# Patient Record
Sex: Male | Born: 1990 | Race: White | Hispanic: No | Marital: Married | Smoking: Never smoker
Health system: Southern US, Community
[De-identification: ages and names within clinical notes are randomized; demographics above are authoritative.]

## PROBLEM LIST (undated history)

## (undated) DIAGNOSIS — T7840XA Allergy, unspecified, initial encounter: Secondary | ICD-10-CM

## (undated) DIAGNOSIS — J309 Allergic rhinitis, unspecified: Secondary | ICD-10-CM

## (undated) DIAGNOSIS — Z8679 Personal history of other diseases of the circulatory system: Secondary | ICD-10-CM

## (undated) DIAGNOSIS — M26629 Arthralgia of temporomandibular joint, unspecified side: Secondary | ICD-10-CM

## (undated) DIAGNOSIS — B029 Zoster without complications: Secondary | ICD-10-CM

## (undated) DIAGNOSIS — Z889 Allergy status to unspecified drugs, medicaments and biological substances status: Secondary | ICD-10-CM

## (undated) DIAGNOSIS — G4700 Insomnia, unspecified: Secondary | ICD-10-CM

## (undated) HISTORY — DX: Arthralgia of temporomandibular joint, unspecified side: M26.629

## (undated) HISTORY — DX: Allergy status to unspecified drugs, medicaments and biological substances: Z88.9

## (undated) HISTORY — PX: NO PAST SURGERIES: SHX2092

## (undated) HISTORY — DX: Allergy, unspecified, initial encounter: T78.40XA

## (undated) HISTORY — DX: Personal history of other diseases of the circulatory system: Z86.79

## (undated) HISTORY — DX: Allergic rhinitis, unspecified: J30.9

## (undated) HISTORY — DX: Insomnia, unspecified: G47.00

## (undated) HISTORY — DX: Zoster without complications: B02.9

---

## 2012-03-22 ENCOUNTER — Ambulatory Visit: Payer: Self-pay | Admitting: Vascular Surgery

## 2012-03-22 LAB — BASIC METABOLIC PANEL
Anion Gap: 8 (ref 7–16)
BUN: 9 mg/dL (ref 7–18)
Chloride: 107 mmol/L (ref 98–107)
Creatinine: 0.67 mg/dL (ref 0.60–1.30)
EGFR (African American): >60
Sodium: 141 mmol/L (ref 136–145)

## 2012-04-11 ENCOUNTER — Ambulatory Visit: Payer: Self-pay | Admitting: Anesthesiology

## 2012-04-11 LAB — CBC
HCT: 44.9 % (ref 40.0–52.0)
HGB: 14.6 g/dL (ref 13.0–18.0)
MCH: 29 pg (ref 26.0–34.0)
Platelet: 221 10*3/uL (ref 150–440)
RBC: 5.04 10*6/uL (ref 4.40–5.90)
WBC: 5.6 10*3/uL (ref 3.8–10.6)

## 2012-04-15 ENCOUNTER — Ambulatory Visit: Payer: Self-pay | Admitting: Vascular Surgery

## 2012-11-18 ENCOUNTER — Ambulatory Visit: Payer: Self-pay | Admitting: Family Medicine

## 2014-10-26 IMAGING — CR DG SHOULDER 3+V*L*
1 series · 3 of 3 positions shown · non-contrast
Comparison: none

RESULT:      There is no evidence of fracture, dislocation, or malalignment.

[Series 1: internal rotate · 0.17mm/px · 3 of 3 slices shown]
[im 1/3]
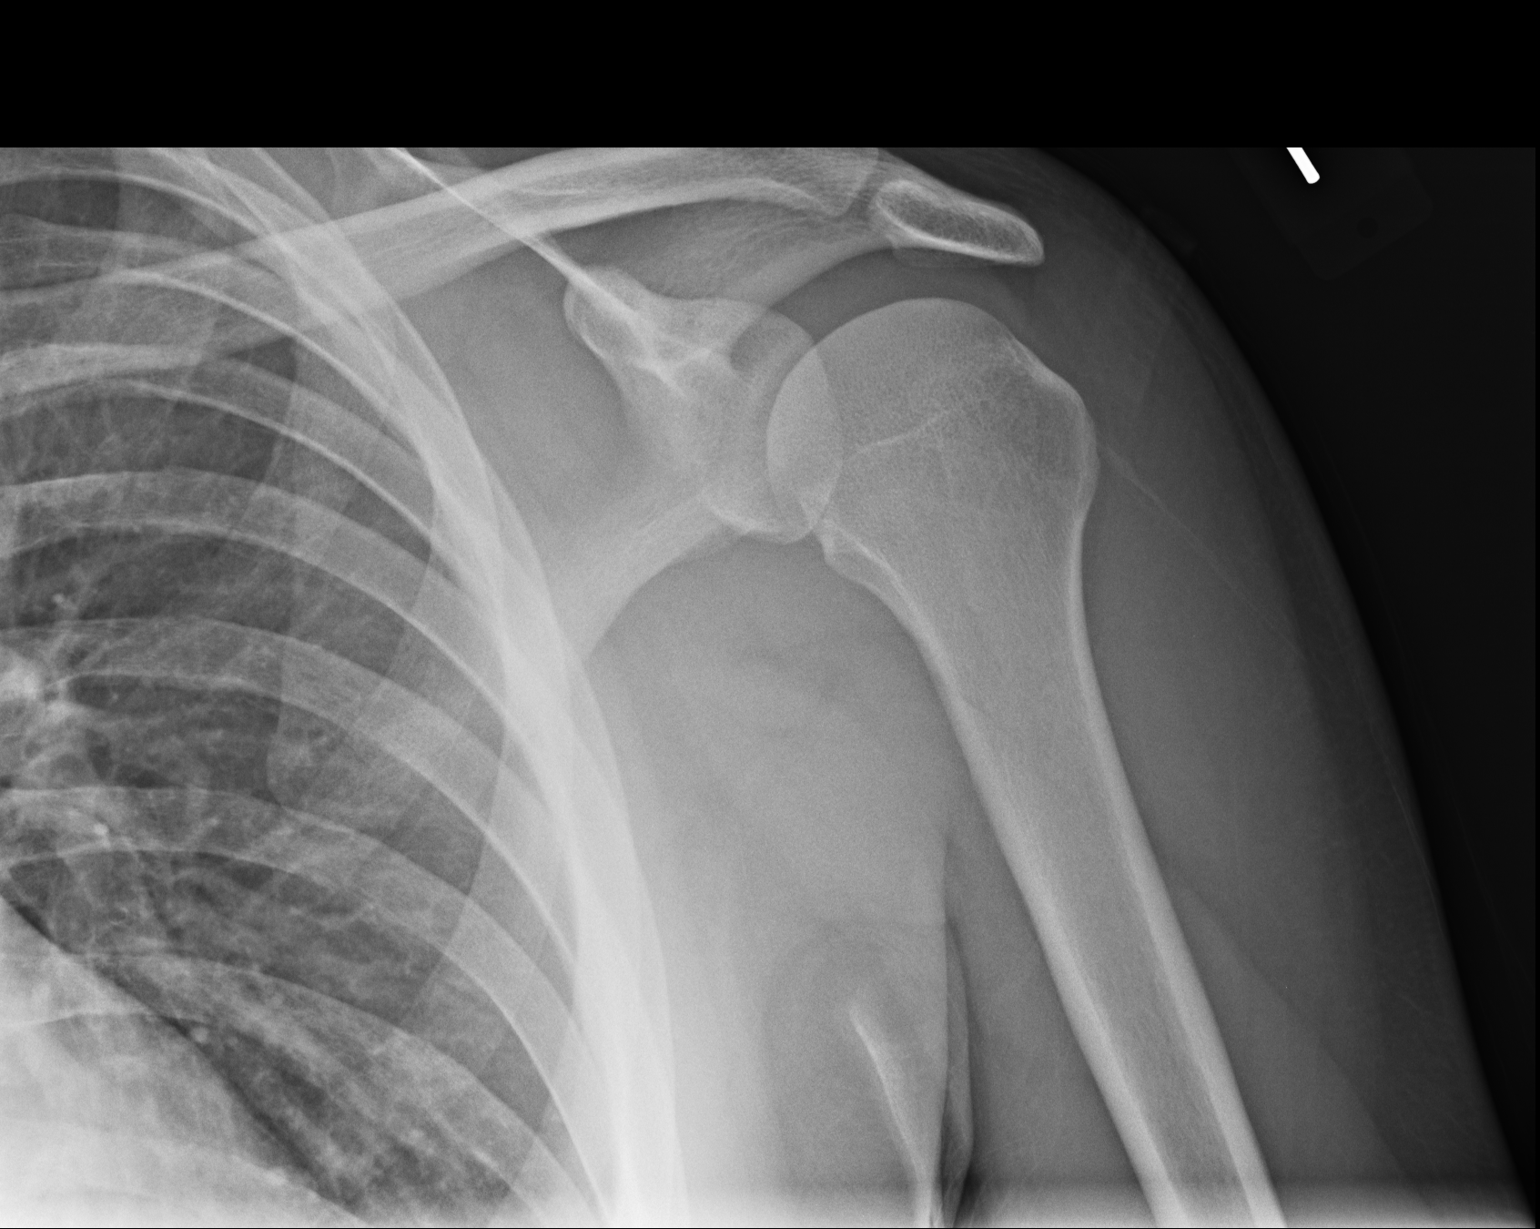
[im 2/3]
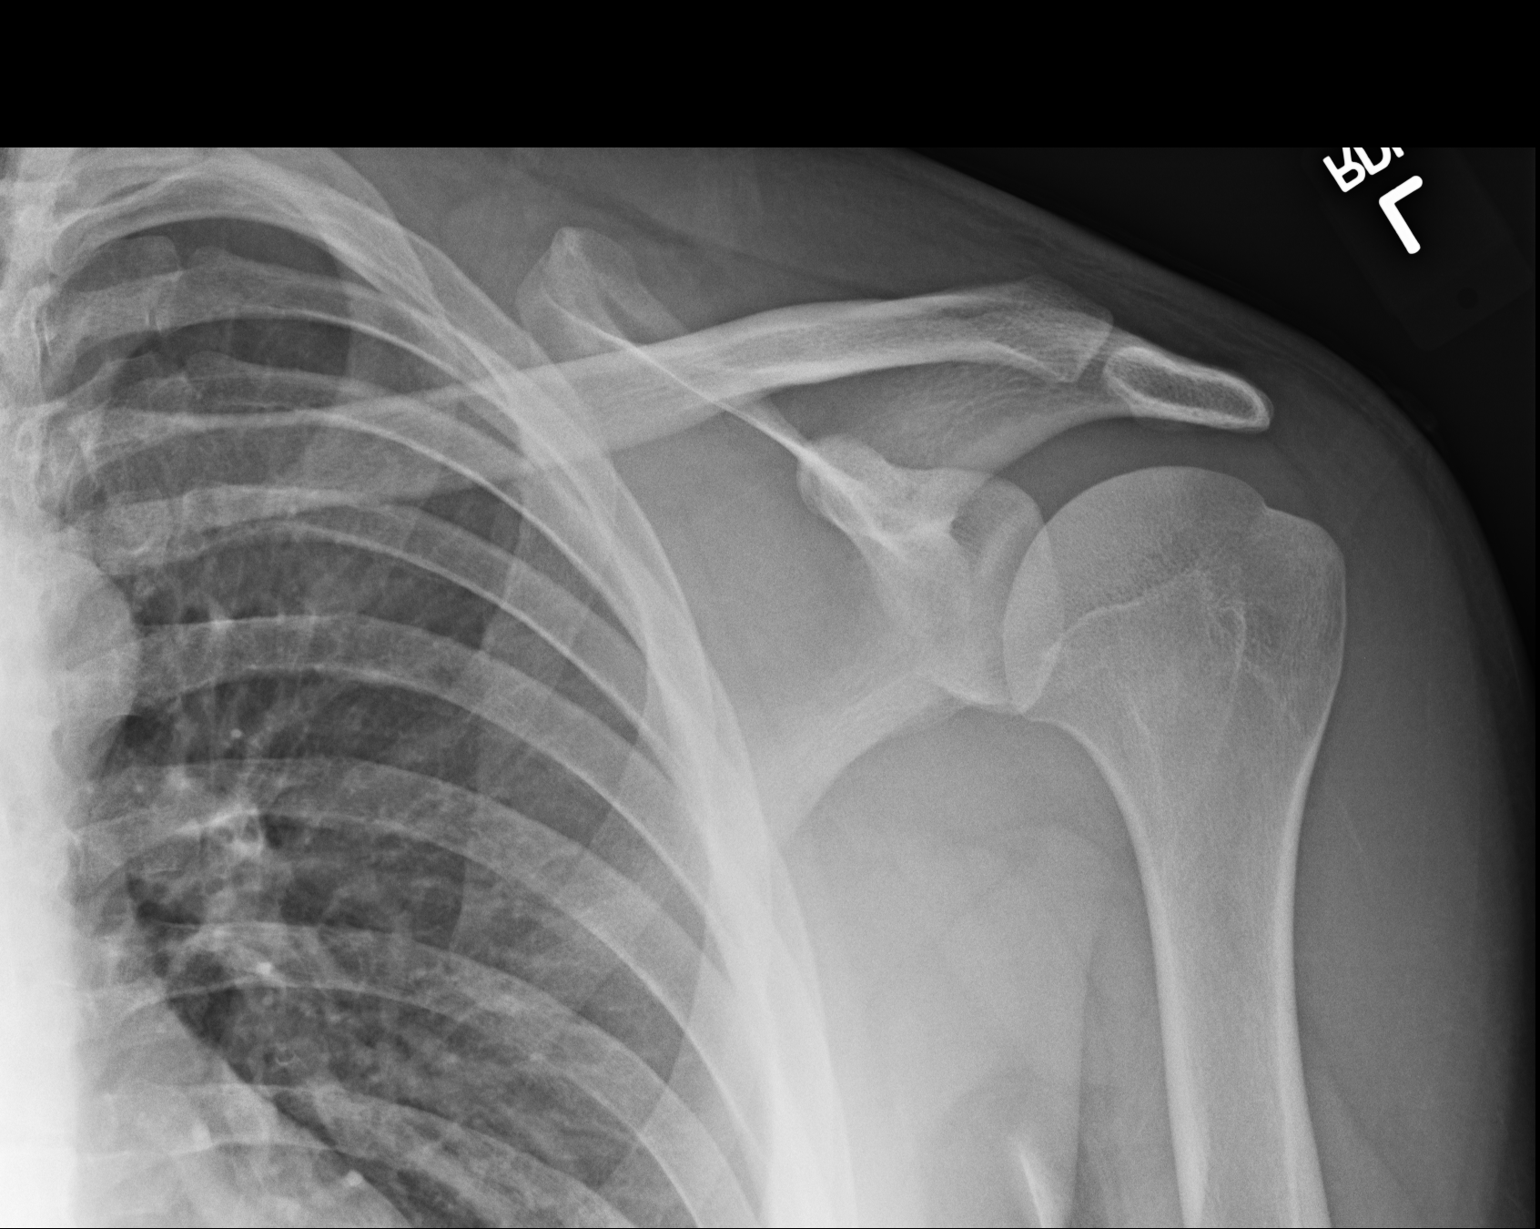
[im 3/3]
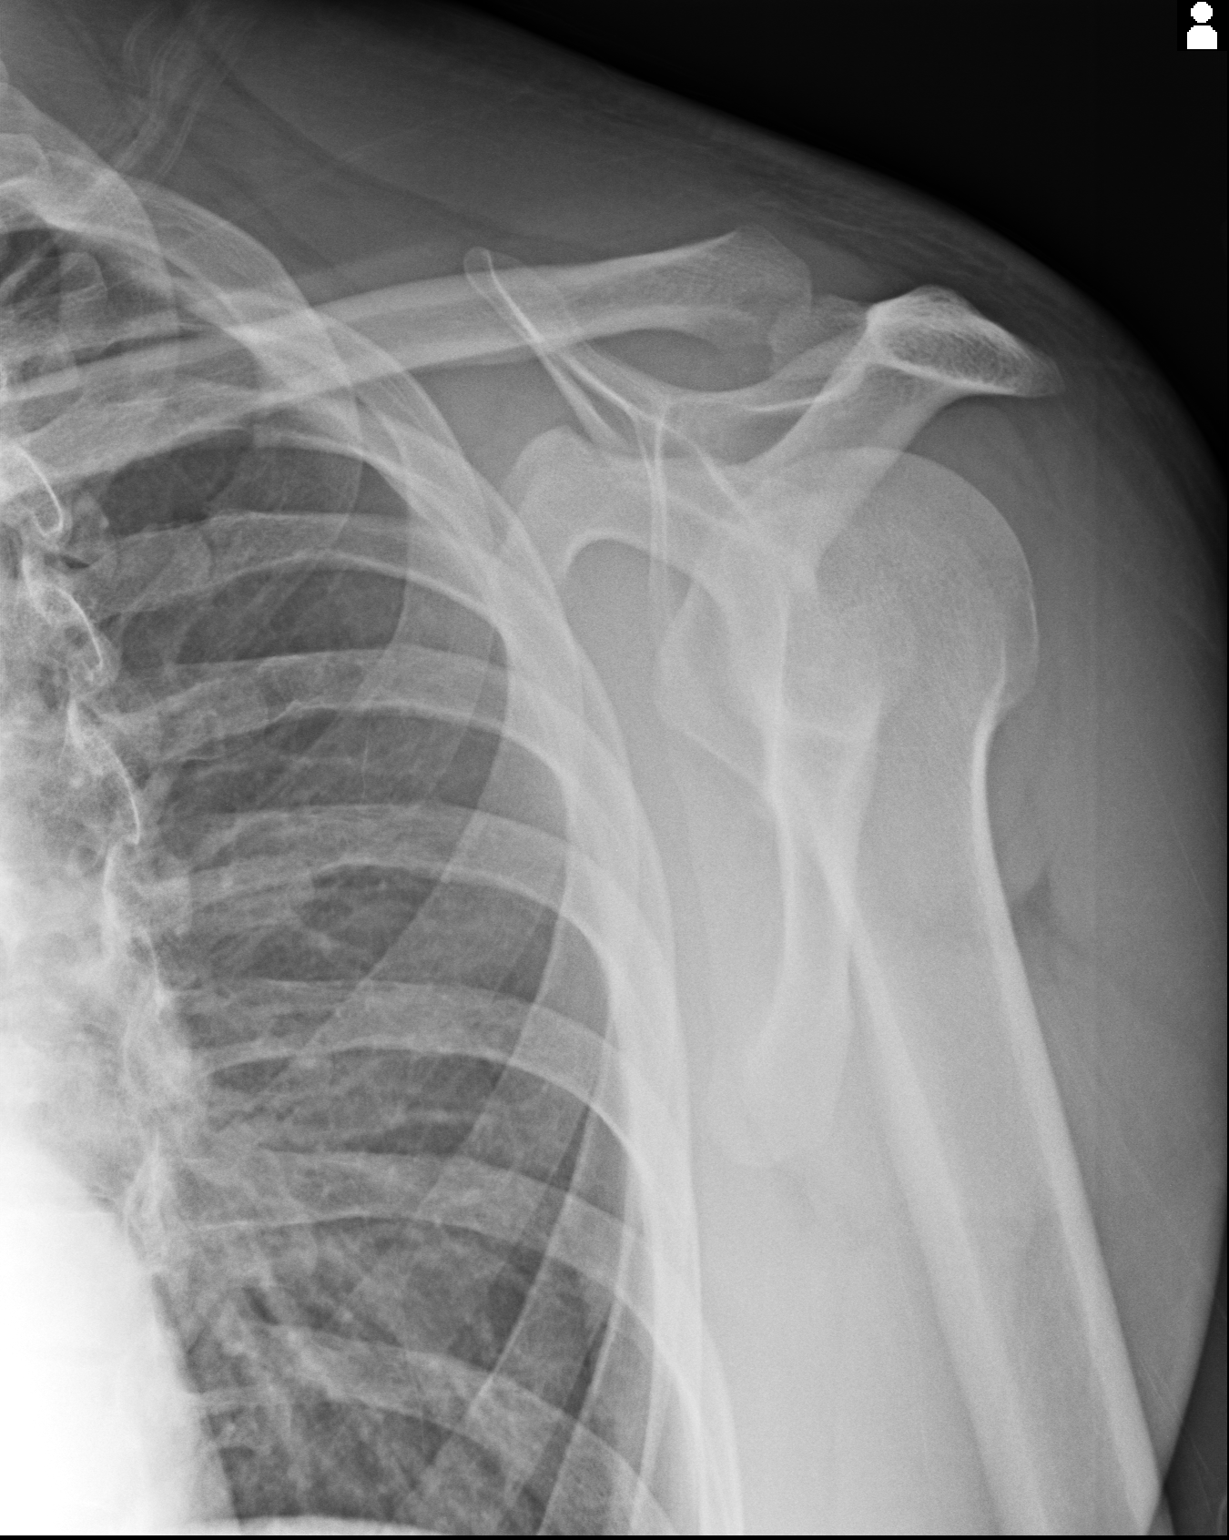

[3 of 3 positions shown; findings below may reference images not displayed]

IMPRESSION: 1. No evidence of acute abnormalities.
2. If there are persistent complaints of pain or persistent clinical
concern, a repeat evaluation in 7-10 days is recommended if clinically
warranted.

## 2015-02-17 NOTE — Op Note (Signed)
PATIENT NAME:  Craig [name], Craig [name] MR#:  [id number] DATE OF BIRTH:  [birth year]  DATE OF PROCEDURE:  03/22/2012  PREOPERATIVE DIAGNOSIS: Venous aneurysms right arm cephalic vein.   POSTOPERATIVE DIAGNOSIS: Venous aneurysms right arm cephalic vein.  PROCEDURE PERFORMED:   1. Introduction catheter into superior vena cava for superior venacavogram.  2. Right upper extremity venography.   SURGEON: Levora DredgeGregory Icelynn Onken, MD   SEDATION: Versed 5 mg plus fentanyl 200 mcg administered IV. Continuous ECG, pulse oximetry and cardiopulmonary monitoring was performed throughout the entire procedure by the Interventional Radiology nurse. Total sedation time was 30 minutes.   ACCESS: Vein right forearm.   CONTRAST USED: Isovue 20 mL.   FLUOROSCOPY TIME: 0.7 minutes.   INDICATIONS: [name] presented to the office with pain associated with a venous aneurysm in the antecubital fossa. Duplex ultrasound demonstrated a second aneurysm in the mid cephalic vein. The central venous anatomy is uncertain, and the patient is, therefore, undergoing venography to plan for surgical treatment of his venous aneurysms. The risks and benefits were reviewed. All questions are answered. The patient agrees to proceed.   DESCRIPTION OF PROCEDURE: The patient is taken to the Special Procedures suite and placed in the supine position. After adequate sedation, his right arm is extended and prepped and draped in a sterile fashion. Ultrasound is placed in a sterile sleeve. Ultrasound is utilized secondary to lack of appropriate landmarks and to avoid vascular injury. Under direct ultrasound visualization, the forearm vein is identified. It is echolucent and homogeneous, easily compressible, indicating patency. Image is recorded for the permanent record. Using a micropuncture needle after 1% lidocaine is infiltrated in the skin, the vein is accessed. A Microwire, followed by a micro sheath is inserted. Right upper extremity venography is then  performed through the micro sheath.   A Glidewire and KMP catheter are then utilized to obtain better images of the shoulder area as well as the central venous anatomy. A Glidewire is introduced through the micropuncture sheath, and the micropuncture sheath is removed, and the KMP catheter is advanced over the wire directly. The catheter is positioned initially at the level of the deltoids and subsequently in the subclavian. After review of the images, the catheter is pulled, light pressure is held, and there are no immediate complications.   INTERPRETATION: Two large aneurysms of the cephalic vein are identified. There is distal runoff via the basilic as well as the brachial veins. The central veins are widely patent.   SUMMARY: No other abnormalities identified in the venous system except for the tandem aneurysms of the cephalic vein.   ____________________________ Renford DillsGregory G. Breelyn Icard, MD ggs:cbb [name]: 03/22/2012 10:55:35 ET T: 03/22/2012 11:28:28 ET JOB#: [id number]  cc: Renford DillsGregory G. Hulet Ehrmann, MD, <Dictator> Renford DillsGREGORY G Mayana Irigoyen MD ELECTRONICALLY SIGNED 03/25/2012 9:00

## 2015-02-17 NOTE — Op Note (Signed)
PATIENT NAME:  [name], Craig [name] DATE OF BIRTH:  [birth year]  DATE OF PROCEDURE:  04/15/2012  PREOPERATIVE DIAGNOSIS: Right arm cephalic vein aneurysm.   POSTOPERATIVE DIAGNOSIS: Right arm cephalic vein aneurysm.   PROCEDURE: Resection right arm cephalic vein aneurysm.   SURGEON: Renford DillsGregory G Nariah Morgano, M.D.   ANESTHESIA: General by LMA.   FLUIDS: Per anesthesia record.   ESTIMATED BLOOD LOSS: Minimal.   SPECIMEN: Venous aneurysm photographed for permanent record.   INDICATIONS: [name] is a 24 year old gentleman who has been having increasing pain associated with a large venous aneurysm. He has undergone preoperative imaging and is now undergoing resection of his aneurysm. The risks and benefits were reviewed. All questions are answered. The patient agrees to proceed.   DESCRIPTION OF PROCEDURE: The patient is taken to the operating room and placed in the supine position. After adequate general anesthesia is induced and appropriate time-out has been called, he is positioned with his right arm extended palm upward. Right arm is prepped and draped in a circumferential fashion.   0.25% Marcaine with epinephrine is infiltrated in the soft tissues overlying the cephalic vein at four separate locations, beginning at the level of the antecubital fossa and in a latter fashion moving more proximal. Four separate incisions are created and the skin is incised with a 15 blade scalpel. The vein is identified. It is then skeletonized from the surrounding soft tissues. Tributaries are ligated with 2-0 silk ties or surgical clips and through this series of incisions the entire segment is removed in two separate but otherwise complete pieces. It is then photographed on the back table. Wounds are inspected. Hemostasis is obtained. 3-0 Vicryl is used to close the subcutaneous tissues and then 4-0 Monocryl is used in a subcuticular fashion. Dermabond is applied. The patient tolerated the procedure well  and there were no immediate complications.      ____________________________ Renford DillsGregory G. Chaeli Judy, MD ggs:bjt D: 04/15/2012 08:43:27 ET T: 04/15/2012 12:57:42 ET JOB#: [id number]  cc: Renford DillsGregory G. Maryjane Benedict, MD, <Dictator> Leo GrosserNancy J. Maloney, MD Renford DillsGREGORY G Donnalee Cellucci MD ELECTRONICALLY SIGNED 04/21/2012 10:03

## 2016-02-28 ENCOUNTER — Encounter: Payer: Self-pay | Admitting: Family Medicine

## 2016-02-28 ENCOUNTER — Ambulatory Visit (INDEPENDENT_AMBULATORY_CARE_PROVIDER_SITE_OTHER): Payer: BLUE CROSS/BLUE SHIELD | Admitting: Family Medicine

## 2016-02-28 VITALS — BP 122/60 | HR 120 | Temp 99.7°F | Resp 16 | Ht 75.0 in | Wt 309.2 lb

## 2016-02-28 DIAGNOSIS — J020 Streptococcal pharyngitis: Secondary | ICD-10-CM

## 2016-02-28 DIAGNOSIS — I868 Varicose veins of other specified sites: Secondary | ICD-10-CM | POA: Insufficient documentation

## 2016-02-28 DIAGNOSIS — J029 Acute pharyngitis, unspecified: Secondary | ICD-10-CM

## 2016-02-28 LAB — POCT RAPID STREP A (OFFICE): Rapid Strep A Screen: POSITIVE — AB

## 2016-02-28 MED ORDER — AMOXICILLIN 875 MG PO TABS: 875.0000 mg | ORAL_TABLET | Freq: Two times a day (BID) | ORAL | Status: DC

## 2016-02-28 NOTE — Progress Notes (Signed)
Subjective:     Patient ID: Craig [name], male   DOB: 03/26/1991, 25 y.o.   MRN: [id number]  HPI  Chief Complaint  Patient presents with  . Sore Throat    Patient comes in office today with complaints of sore throat, fever and body ache for the past 24hrs. Patient states that he was around his nephew this week who had tested positive for strep two days ago. Patient denies any difficulty swallowing, he has been taking otc Tylenol for relief.     Review of Systems     Objective:   Physical Exam  Constitutional: He appears well-developed and well-nourished. No distress.  Ears: T.M's intact without inflammation Throat: moderate tonsillar enlargement, mild erythema, no exudate noted Neck: no cervical adenopathy Lungs: clear     Assessment:    1. Pharyngitis - POCT rapid strep A  2. Strep throat - amoxicillin (AMOXIL) 875 MG tablet; Take 1 tablet (875 mg total) by mouth 2 (two) times daily.  Dispense: 20 tablet; Refill: 0    Plan:    Follow up as needed. Discussed infection control measures.

## 2016-02-28 NOTE — Patient Instructions (Signed)
Wash hands frequently and don't share food or drink.

## 2016-11-02 ENCOUNTER — Telehealth: Payer: Self-pay | Admitting: Family Medicine

## 2016-11-02 NOTE — Telephone Encounter (Signed)
Please call pt. Thank you.

## 2016-11-02 NOTE — Telephone Encounter (Signed)
Please advise. Emily Drozdowski, CMA  

## 2016-11-02 NOTE — Telephone Encounter (Signed)
Pt called wanting to know if you would take on his wife Neysa BonitoChristy as a new patient.  She is not on any medications and does not have a primary care doctor.  Their call back number is [phone]  Thanks, Barth Kirksteri

## 2016-11-02 NOTE — Telephone Encounter (Signed)
Not taking new patients at this time.

## 2017-06-09 ENCOUNTER — Ambulatory Visit (INDEPENDENT_AMBULATORY_CARE_PROVIDER_SITE_OTHER): Payer: BLUE CROSS/BLUE SHIELD | Admitting: Family Medicine

## 2017-06-09 ENCOUNTER — Encounter: Payer: Self-pay | Admitting: Family Medicine

## 2017-06-09 ENCOUNTER — Telehealth: Payer: Self-pay | Admitting: Family Medicine

## 2017-06-09 VITALS — BP 122/78 | HR 74 | Temp 98.6°F | Resp 16 | Wt 316.0 lb

## 2017-06-09 DIAGNOSIS — H109 Unspecified conjunctivitis: Secondary | ICD-10-CM | POA: Diagnosis not present

## 2017-06-09 MED ORDER — POLYMYXIN B-TRIMETHOPRIM 10000-0.1 UNIT/ML-% OP SOLN: 2.0000 [drp] | Freq: Four times a day (QID) | OPHTHALMIC | 0 refills | Status: DC

## 2017-06-09 NOTE — Telephone Encounter (Signed)
Advised patient as below and he will be seen in office this morning. KW

## 2017-06-09 NOTE — Telephone Encounter (Signed)
Please review message. KW 

## 2017-06-09 NOTE — Progress Notes (Deleted)
eye

## 2017-06-09 NOTE — Telephone Encounter (Signed)
For viral pinkeye the treatment is use of frequent warm, wet compresses. If you develop yellow drainage (not watery) then we will need to see you.

## 2017-06-09 NOTE — Patient Instructions (Signed)
Discussed use of frequent warm compresses.

## 2017-06-09 NOTE — Telephone Encounter (Signed)
pt called saying son has confirmed pinkeye.  Now he is having the same symptoms.  Can we call in something for this.  [facility]  Call back is [phone]  Thanks Barth Kirksteri

## 2017-06-09 NOTE — Progress Notes (Signed)
Subjective:     Patient ID: Craig SprinklesCaleb D Sprung, male   DOB: 30-Dec-1990, 26 y.o.   MRN: [id number]  HPI  Chief Complaint  Patient presents with  . Eye Drainage    Patient comes in office today with concerns of exposure to pink eye from child. Patient states for the past 24hrs he has had yellow drainage, itchy eyes and crusting of how right eye. Patient used last night prescription eye drops from child to help with symptoms  Reports his son has gotten quickly better on Polytrim drops. Denies changes in his vision or contact use.   Review of Systems     Objective:   Physical Exam  Constitutional: He appears well-developed and well-nourished. No distress.  Eyes: Pupils are equal, round, and reactive to light. Right eye exhibits no discharge.  Right eye with mild medial scleral erythema and mild upper eyelid swelling V.A. Intact to # of fingers.       Assessment:    1. Bacterial conjunctivitis of right eye - trimethoprim-polymyxin b (POLYTRIM) ophthalmic solution; Place 2 drops into the right eye every 6 (six) hours.  Dispense: 10 mL; Refill: 0    Plan:   Discussed use of warm, wet compresses.

## 2017-06-12 DIAGNOSIS — M79644 Pain in right finger(s): Secondary | ICD-10-CM | POA: Diagnosis not present

## 2017-06-12 DIAGNOSIS — R600 Localized edema: Secondary | ICD-10-CM | POA: Diagnosis not present

## 2017-06-12 DIAGNOSIS — S66911A Strain of unspecified muscle, fascia and tendon at wrist and hand level, right hand, initial encounter: Secondary | ICD-10-CM | POA: Diagnosis not present

## 2017-06-12 DIAGNOSIS — S56117A Strain of flexor muscle, fascia and tendon of right little finger at forearm level, initial encounter: Secondary | ICD-10-CM | POA: Diagnosis not present

## 2017-06-12 DIAGNOSIS — S6991XA Unspecified injury of right wrist, hand and finger(s), initial encounter: Secondary | ICD-10-CM | POA: Diagnosis not present

## 2017-06-12 DIAGNOSIS — R5383 Other fatigue: Secondary | ICD-10-CM | POA: Diagnosis not present

## 2017-09-24 ENCOUNTER — Telehealth: Payer: Self-pay | Admitting: Family Medicine

## 2017-09-24 ENCOUNTER — Encounter: Payer: Self-pay | Admitting: Family Medicine

## 2017-09-24 ENCOUNTER — Ambulatory Visit (INDEPENDENT_AMBULATORY_CARE_PROVIDER_SITE_OTHER): Payer: BLUE CROSS/BLUE SHIELD | Admitting: Family Medicine

## 2017-09-24 VITALS — BP 130/78 | HR 103 | Temp 98.7°F | Resp 16 | Wt 320.4 lb

## 2017-09-24 DIAGNOSIS — J039 Acute tonsillitis, unspecified: Secondary | ICD-10-CM | POA: Diagnosis not present

## 2017-09-24 LAB — POCT RAPID STREP A (OFFICE): Rapid Strep A Screen: NEGATIVE

## 2017-09-24 NOTE — Patient Instructions (Signed)
Discussed use of Mucinex D for congestion, Delsym for cough, and Benadryl for postnasal drainage 

## 2017-09-24 NOTE — Progress Notes (Signed)
Subjective:     Patient ID: Craig [name], male   DOB: [birth year], 26 y.o.   MRN: [id number] Chief Complaint  Patient presents with  . Sore Throat    Patient comes into office with complaint of sore throat for the past 24hrs, patient reports pain when swallowing liquids or solids. Patient denies any URI symptoms.    HPI Reports the flu shot one month ago.  Review of Systems     Objective:   Physical Exam  Constitutional: He appears well-developed and well-nourished. No distress.  Ears: T.M's intact without inflammation Throat:moderately enlarged tonsils with small amount of exudate. Neck: no cervical adenopathy Lungs: clear     Assessment:    1. Tonsillitis: probable early URI - POCT rapid strep A    Plan:    Discussed use of otc medication.

## 2018-08-10 ENCOUNTER — Ambulatory Visit (INDEPENDENT_AMBULATORY_CARE_PROVIDER_SITE_OTHER): Payer: BLUE CROSS/BLUE SHIELD | Admitting: Physician Assistant

## 2018-08-10 VITALS — BP 152/88 | HR 92 | Temp 98.9°F

## 2018-08-10 DIAGNOSIS — Z91038 Other insect allergy status: Secondary | ICD-10-CM | POA: Diagnosis not present

## 2018-08-10 MED ORDER — PREDNISONE 10 MG (21) PO TBPK: ORAL_TABLET | ORAL | 0 refills | Status: DC

## 2018-08-10 NOTE — Patient Instructions (Signed)
Insect Bite, Adult An insect bite can make your skin red, itchy, and swollen. Some insects can spread disease to people with a bite. However, most insect bites do not lead to disease, and most are not serious. Follow these instructions at home: Bite area care  Do not scratch the bite area.  Keep the bite area clean and dry.  Wash the bite area every day with soap and water as told by your doctor.  Check the bite area every day for signs of infection. Check for: ? More redness, swelling, or pain. ? Fluid or blood. ? Warmth. ? Pus. Managing pain, itching, and swelling  You may put any of these on the bite area as told by your doctor: ? A baking soda paste. ? Cortisone cream. ? Calamine lotion.  If directed, put ice on the bite area. ? Put ice in a plastic bag. ? Place a towel between your skin and the bag. ? Leave the ice on for 20 minutes, 2-3 times a day. Medicines  Take medicines or put medicines on your skin only as told by your doctor.  If you were prescribed an antibiotic medicine, use it as told by your doctor. Do not stop using the antibiotic even if your condition improves. General instructions  Keep all follow-up visits as told by your doctor. This is important. How is this prevented? To help you have a lower risk of insect bites:  When you are outside, wear clothing that covers your arms and legs.  Use insect repellent. The best insect repellents have: ? An active ingredient of DEET, picaridin, oil of lemon eucalyptus (OLE), or IR3535. ? Higher amounts of DEET or another active ingredient than other repellents have.  If your home windows do not have screens, think about putting some in.  Contact a doctor if:  You have more redness, swelling, or pain in the bite area.  You have fluid, blood, or pus coming from the bite area.  The bite area feels warm.  You have a fever. Get help right away if:  You have joint pain.  You have a rash.  You have  shortness of breath.  You feel more tired or sleepy than you normally do.  You have neck pain.  You have a headache.  You feel weaker than you normally do.  You have chest pain.  You have pain in your belly.  You feel sick to your stomach (nauseous) or you throw up (vomit). Summary  An insect bite can make your skin red, itchy, and swollen.  Do not scratch the bite area, and keep it clean and dry.  Ice can help with pain and itching from the bite. This information is not intended to replace advice given to you by your health care provider. Make sure you discuss any questions you have with your health care provider. Document Released: 10/09/2000 Document Revised: 05/14/2016 Document Reviewed: 02/27/2015 Elsevier Interactive Patient Education  2018 Elsevier Inc.  

## 2018-08-10 NOTE — Progress Notes (Signed)
       Patient: Craig [name] Male    DOB: 1991/07/05   27 y.o.   MRN: [id number] Visit Date: 08/10/2018  Today's Provider: Trey Sailors, PA-C   Chief Complaint  Patient presents with  . Insect Bite   Subjective:    Insect Bite Patient presents today with ant bites to the left hand, right arm and pinky finger. These happened last night. Patient has swollen in the left hand and right forearm. There is some slight fluid weeping from areas. He reports that there has been no pus or pain in the areas. He reports that the areas has been very itchy and he has been scratching it. Patient denies fevers, chills, nausea, and vomiting. symptoms at this time. Patient has not taking any medication for the bites.  BP Readings from Last 3 Encounters:  08/10/18 (!) 152/88  09/24/17 130/78  06/09/17 122/78       No Known Allergies  No current outpatient medications on file.  Review of Systems  Constitutional: Negative.   HENT: Negative.   Eyes: Negative.   Cardiovascular: Negative.   Gastrointestinal: Negative.   Musculoskeletal: Negative.   Allergic/Immunologic: Negative.   Neurological: Negative.     Social History   Tobacco Use  . Smoking status: Never Smoker  . Smokeless tobacco: Never Used  Substance Use Topics  . Alcohol use: Not on file   Objective:   BP (!) 152/88 (BP Location: Right Arm, Patient Position: Sitting, Cuff Size: Normal)   Pulse 92   Temp 98.9 F (37.2 C) (Oral)   SpO2 98%  Vitals:   08/10/18 1149  BP: (!) 152/88  Pulse: 92  Temp: 98.9 F (37.2 C)  TempSrc: Oral  SpO2: 98%     Physical Exam  Constitutional: He is oriented to person, place, and time.  Musculoskeletal:       Arms:      Hands: Neurological: He is alert and oriented to person, place, and time.  Skin: Skin is warm and dry. There is erythema.  Psychiatric: He has a normal mood and affect. His behavior is normal.        Assessment & Plan:     1. 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 Infection, though 2/2 infection is a possibility. Will treat with steroid as below and would like patient to call back if worsening, would send antibiotic at that point.  - predniSONE (STERAPRED UNI-PAK 21 TAB) 10 MG (21) TBPK tablet; Take 6 pills on day 1, take 5 pills on day 2, take 4 pills on day 3 and so on until complete.  Dispense: 21 tablet; Refill: 0  Follow up Prn if symptoms worsen  The entirety of the information documented in the History of Present Illness, Review of Systems and Physical Exam were personally obtained by me. Portions of this information were initially documented by Rondel Baton, CMA and reviewed by me for thoroughness and accuracy.        Trey Sailors, PA-C  [facility]

## 2018-09-22 DIAGNOSIS — S39012A Strain of muscle, fascia and tendon of lower back, initial encounter: Secondary | ICD-10-CM | POA: Diagnosis not present

## 2018-09-22 DIAGNOSIS — M545 Low back pain: Secondary | ICD-10-CM | POA: Diagnosis not present

## 2018-09-22 DIAGNOSIS — X501XXA Overexertion from prolonged static or awkward postures, initial encounter: Secondary | ICD-10-CM | POA: Diagnosis not present

## 2019-12-20 DIAGNOSIS — Z20828 Contact with and (suspected) exposure to other viral communicable diseases: Secondary | ICD-10-CM | POA: Diagnosis not present

## 2020-01-03 DIAGNOSIS — Z87898 Personal history of other specified conditions: Secondary | ICD-10-CM | POA: Insufficient documentation

## 2020-01-03 DIAGNOSIS — L259 Unspecified contact dermatitis, unspecified cause: Secondary | ICD-10-CM | POA: Insufficient documentation

## 2020-01-03 DIAGNOSIS — Q279 Congenital malformation of peripheral vascular system, unspecified: Secondary | ICD-10-CM | POA: Insufficient documentation

## 2020-01-03 DIAGNOSIS — J309 Allergic rhinitis, unspecified: Secondary | ICD-10-CM | POA: Insufficient documentation

## 2020-01-03 DIAGNOSIS — G4700 Insomnia, unspecified: Secondary | ICD-10-CM | POA: Insufficient documentation

## 2020-01-03 DIAGNOSIS — Z789 Other specified health status: Secondary | ICD-10-CM | POA: Insufficient documentation

## 2020-01-03 DIAGNOSIS — B029 Zoster without complications: Secondary | ICD-10-CM | POA: Insufficient documentation

## 2020-01-03 DIAGNOSIS — M26609 Unspecified temporomandibular joint disorder, unspecified side: Secondary | ICD-10-CM | POA: Insufficient documentation

## 2020-01-03 NOTE — Progress Notes (Signed)
Patient: Craig [name] Male    DOB: [birth year]   29 y.o.   MRN: [id number] Visit Date: 01/03/2020  Today's Provider: Marcille Buffy, FNP   Chief Complaint  Patient presents with  . Sinus Problem   Subjective:    Virtual Visit via Video Note  I connected with Craig [name] on 01/03/20 at 11:00 AM EST by a video enabled telemedicine application and verified that I am speaking with the correct person using two identifiers.  Location: Patient: at home  Provider: Provider: Provider's office at  [facility], [place] .      I discussed the limitations of evaluation and management by telemedicine and the availability of in person appointments. The patient expressed understanding and agreed to proceed.    I discussed the assessment and treatment plan with the patient. The patient was provided an opportunity to ask questions and all were answered. The patient agreed with the plan and demonstrated an understanding of the instructions.   The patient was advised to call back or seek an in-person evaluation if the symptoms worsen or if the condition fails to improve as anticipated.  I provided 30 minutes of non-face-to-face time during this encounter inculding time spent reviewing his chart and medical history.   Sinus Problem This is a new problem. The problem is unchanged. There has been no fever. Associated symptoms include congestion, coughing, sinus pressure, sneezing and a sore throat. Pertinent negatives include no chills, diaphoresis, ear pain, headaches, hoarse voice, neck pain, shortness of breath or swollen glands (only in AM). (Lost of taste and smell still present  ( patient states that he was diagnosed with Covid 2 weeks ago)) Past treatments include acetaminophen. The treatment provided no relief.  Rash This is a new problem. The current episode started in the past 7 days. The affected locations include the right hand and right arm. The rash is  characterized by blistering. It is unknown (patient believes exposed to poison ivy) if there was an exposure to a precipitant. Associated symptoms include congestion, coughing and a sore throat. Pertinent negatives include no shortness of breath.   [facility]. His two year old was negative but was sick a few days before he got sick.  He had a fever, headache, chills sore throat with Covid. He reports all has resolved, he has persistent nasal congestion making it hard to breath out of his nose. He started Zyrtec 10 mg around one week ago without any considerable improvement. . Mild cough. Sneezing. He denies any chest pain, chest congestion or chest pressure. He denies any distress.   Rash developed in the past 7 days after getting into some poison oak. He reports high sensitivity to it. He has intense itching without any burning. Denies any painful or burning rash. Denies paresthesias. History of contact dermatitis. History of shingles, however he reports this does not feel the same.   Patient  denies any fever, body aches,chills,chest pain, shortness of breath, nausea, vomiting, or diarrhea.    He was the only one in his family that tested positive. He has been out of work for the 14 days per [facility] however now has rash and still persistent congestion.   He denies any liver or kidney dysfunction, he is not up to date on his labs.  Denies fever and not on any fever reducers.  He denies any other illness/ exposure. Denies fatigue.  No Known Allergies No current outpatient medications on file.  Review of Systems  Constitutional: Negative for chills and diaphoresis.  HENT: Positive for congestion, sinus pressure, sneezing and sore throat. Negative for ear pain and hoarse voice.   Respiratory: Positive for cough. Negative for shortness of breath.   Musculoskeletal: Negative for neck pain.  Skin: Positive for rash.  Neurological: Negative for  headaches.    Social History   Tobacco Use  . Smoking status: Never Smoker  . Smokeless tobacco: Never Used  Substance Use Topics  . Alcohol use: Not on file      Objective:  No vital signs available.  Patient is at his home.  Provider is in office at [facility].   Patient is alert and oriented and responsive to questions Engages in conversation with provider. Speaks in full sentences without any pauses without any shortness of breath or distress.  VIDEO EXAM AS BELOW AND LIMITED DUE TO NOT IN PERSON DUE TO COVID 19 OFFICE PROTOCOL. Physical Exam Constitutional:      Appearance: Normal appearance. He is not ill-appearing or toxic-appearing.  Skin:    Findings: Rash present. Rash is vesicular.          Comments: Video exam difficult, however able to mildly erythematous rash with scattered fluid filled blisters on right palmar surface, anterior and posterior right arm.  No facial or eye involvement.    Neurological:     Mental Status: He is alert and oriented to person, place, and time.     GCS: GCS eye subscore is 4. GCS verbal subscore is 5. GCS motor subscore is 6.  Psychiatric:        Mood and Affect: Mood normal.        Behavior: Behavior normal.        Thought Content: Thought content normal.        Judgment: Judgment normal.     Comments: Video exam only. Discussed limitations. He has normal affect and answers questions appropriately.       No results found for any visits on 01/04/20.     Assessment & Plan    Acute non-recurrent pansinusitis  Irritant contact dermatitis, unspecified trigger- exposure to poison oak   Personal history of covid-19  Meds ordered this encounter  Medications  . amoxicillin-clavulanate (AUGMENTIN) 875-125 MG tablet    Sig: Take 1 tablet by mouth 2 (two) times daily.    Dispense:  20 tablet    Refill:  0  . predniSONE (DELTASONE) 10 MG tablet    Sig: Take 6 tablets (60 mg total) by mouth daily with breakfast  for 2 days, THEN 5 tablets (50 mg total) daily with breakfast for 2 days, THEN 4 tablets (40 mg total) daily with breakfast for 2 days, THEN 3 tablets (30 mg total) daily with breakfast for 2 days, THEN 2 tablets (20 mg total) daily with breakfast for 2 days, THEN 1 tablet (10 mg total) daily with breakfast for 2 days.    Dispense:  42 tablet    Refill:  0  . fluticasone (FLONASE) 50 MCG/ACT nasal spray    Sig: Place 2 sprays into both nostrils daily.    Dispense:  16 g    Refill:  1   Continue Zyrtec is recommended. He does need maintaince labs and physical as they are overdue, he was made aware and will schedule these once he is well.  If rash spreads or worsens he will seek in person visit. Recommend  Calamine lotion  Per package or [place] per package to apply to rash locally on right arm, wash hand after touching and do not touch face.  Advised patient call the office or your primary care doctor for an appointment if no improvement within 72 hours or if any symptoms change or worsen at any time  Advised ER or urgent Care if after hours or on weekend. Call 911 for emergency symptoms at any time.Patinet verbalized understanding of all instructions given/reviewed and treatment plan and has no further questions or concerns at this time.    Work note given return to work Monday March 15 th if all symptoms resolving, as he ahs met his 14 day CDC quarantine. Work note was sent to patients [facility]    The entirety of the information documented in the History of Present Illness, Review of Systems and Physical Exam were personally obtained by me. Portions of this information were initially documented by the  Certified Medical Assistant whose name is documented in [place] and reviewed by me for thoroughness and accuracy.  I have personally performed the exam and reviewed the chart and it is accurate to the best of my knowledge.  Haematologist has been used and any errors in dictation or transcription are  unintentional.   Marcille Buffy, [place]

## 2020-01-04 ENCOUNTER — Ambulatory Visit (INDEPENDENT_AMBULATORY_CARE_PROVIDER_SITE_OTHER): Payer: BC Managed Care – PPO | Admitting: Adult Health

## 2020-01-04 ENCOUNTER — Encounter: Payer: Self-pay | Admitting: Adult Health

## 2020-01-04 DIAGNOSIS — Z13220 Encounter for screening for lipoid disorders: Secondary | ICD-10-CM

## 2020-01-04 DIAGNOSIS — J0140 Acute pansinusitis, unspecified: Secondary | ICD-10-CM

## 2020-01-04 DIAGNOSIS — L249 Irritant contact dermatitis, unspecified cause: Secondary | ICD-10-CM

## 2020-01-04 DIAGNOSIS — Z8616 Personal history of COVID-19: Secondary | ICD-10-CM | POA: Diagnosis not present

## 2020-01-04 DIAGNOSIS — Z1329 Encounter for screening for other suspected endocrine disorder: Secondary | ICD-10-CM

## 2020-01-04 MED ORDER — FLUTICASONE PROPIONATE 50 MCG/ACT NA SUSP: 2.0000 | Freq: Every day | NASAL | 1 refills | Status: DC

## 2020-01-04 MED ORDER — PREDNISONE 10 MG PO TABS: ORAL_TABLET | ORAL | 0 refills | Status: AC

## 2020-01-04 MED ORDER — AMOXICILLIN-POT CLAVULANATE 875-125 MG PO TABS: 1.0000 | ORAL_TABLET | Freq: Two times a day (BID) | ORAL | 0 refills | Status: DC

## 2020-01-04 NOTE — Patient Instructions (Signed)
Prednisolone tablets What is this medicine? PREDNISOLONE (pred NISS oh lone) is a corticosteroid. It is commonly used to treat inflammation of the skin, joints, lungs, and other organs. Common conditions treated include asthma, allergies, and arthritis. It is also used for other conditions, such as blood disorders and diseases of the adrenal glands. This medicine may be used for other purposes; ask your health care provider or pharmacist if you have questions. COMMON BRAND NAME(S): Millipred, Millipred DP, Millipred DP 12-Day, Millipred DP 6 Day, Prednoral What should I tell my health care provider before I take this medicine? They need to know if you have any of these conditions:  Cushing's syndrome  diabetes  glaucoma  heart problems or disease  high blood pressure  infection such as herpes, measles, tuberculosis, or chickenpox  kidney disease  liver disease  mental problems  myasthenia gravis  osteoporosis  seizures  stomach ulcer or intestine disease including colitis and diverticulitis  thyroid problem  an unusual or allergic reaction to lactose, prednisolone, other medicines, foods, dyes, or preservatives  pregnant or trying to get pregnant  breast-feeding How should I use this medicine? Take this medicine by mouth with a glass of water. Follow the directions on the prescription label. Take it with food or milk to avoid stomach upset. If you are taking this medicine once a day, take it in the morning. Do not take more medicine than you are told to take. Do not suddenly stop taking your medicine because you may develop a severe reaction. Your doctor will tell you how much medicine to take. If your doctor wants you to stop the medicine, the dose may be slowly lowered over time to avoid any side effects. Talk to your pediatrician regarding the use of this medicine in children. Special care may be needed. Overdosage: If you think you have taken too much of this medicine  contact a poison control center or emergency room at once. NOTE: This medicine is only for you. Do not share this medicine with others. What if I miss a dose? If you miss a dose, take it as soon as you can. If it is almost time for your next dose, take only that dose. Do not take double or extra doses. What may interact with this medicine? Do not take this medicine with any of the following medications:  metyrapone  mifepristone This medicine may also interact with the following medications:  aminoglutethimide  amphotericin B  aspirin and aspirin-like medicines  barbiturates  certain medicines for diabetes, like glipizide or glyburide  cholestyramine  cholinesterase inhibitors  cyclosporine  digoxin  diuretics  ephedrine  male hormones, like estrogens and birth control pills  isoniazid  ketoconazole  NSAIDS, medicines for pain and inflammation, like ibuprofen or naproxen  phenytoin  rifampin  toxoids  vaccines  warfarin This list may not describe all possible interactions. Give your health care provider a list of all the medicines, herbs, non-prescription drugs, or dietary supplements you use. Also tell them if you smoke, drink alcohol, or use illegal drugs. Some items may interact with your medicine. What should I watch for while using this medicine? Visit your doctor or health care professional for regular checks on your progress. If you are taking this medicine over a prolonged period, carry an identification card with your name and address, the type and dose of your medicine, and your doctor's name and address. This medicine may increase your risk of getting an infection. Tell your doctor or health care professional  if you are around anyone with measles or chickenpox, or if you develop sores or blisters that do not heal properly. If you are going to have surgery, tell your doctor or health care professional that you have taken this medicine within the last  twelve months. Ask your doctor or health care professional about your diet. You may need to lower the amount of salt you eat. This medicine may increase blood sugar. Ask your healthcare provider if changes in diet or medicines are needed if you have diabetes. What side effects may I notice from receiving this medicine? Side effects that you should report to your doctor or health care professional as soon as possible:  allergic reactions like skin rash, itching or hives, swelling of the face, lips, or tongue  changes in emotions or moods  eye pain   signs and symptoms of high blood sugar such as being more thirsty or hungry or having to urinate more than normal. You may also feel very tired or have blurry vision.  signs and symptoms of infection like fever or chills; cough; sore throat; pain or trouble passing urine  slow growth in children (if used for longer periods of time)  swelling of ankles, feet  trouble sleeping  weak bones (if used for longer periods of time) Side effects that usually do not require medical attention (report to your doctor or health care professional if they continue or are bothersome):  nausea  skin problems, acne, thin and shiny skin  upset stomach  weight gain This list may not describe all possible side effects. Call your doctor for medical advice about side effects. You may report side effects to FDA at 1-800-FDA-1088. Where should I keep my medicine? Keep out of the reach of children. Store at room temperature between 15 and 30 degrees C (59 and 86 degrees F). Keep container tightly closed. Throw away any unused medicine after the expiration date. NOTE: This sheet is a summary. It may not cover all possible information. If you have questions about this medicine, talk to your doctor, pharmacist, or health care provider.  2020 Elsevier/Gold Standard (2018-07-14 10:30:56)    Fluticasone nasal spray What is this medicine? FLUTICASONE (floo TIK a  sone) is a corticosteroid. This medicine is used to treat the symptoms of allergies like sneezing, itchy red eyes, and itchy, runny, or stuffy nose. This medicine is also used to treat nasal polyps. This medicine may be used for other purposes; ask your health care provider or pharmacist if you have questions. COMMON BRAND NAME(S): ClariSpray, Flonase, Flonase Allergy Relief, Flonase Sensimist, Veramyst, XHANCE What should I tell my health care provider before I take this medicine? They need to know if you have any of these conditions:  eye disease, vision problems  infection, like tuberculosis, herpes, or fungal infection  recent surgery on nose or sinuses  taking a corticosteroid by mouth  an unusual or allergic reaction to fluticasone, steroids, other medicines, foods, dyes, or preservatives  pregnant or trying to get pregnant  breast-feeding How should I use this medicine? This medicine is for use in the nose. Follow the directions on your product or prescription label. This medicine works best if used at regular intervals. Do not use more often than directed. Make sure that you are using your nasal spray correctly. After 6 months of daily use for allergies, talk to your doctor or health care professional before using it for a longer time. Ask your doctor or health care professional if you  have any questions. Talk to your pediatrician regarding the use of this medicine in children. Special care may be needed. Some products have been used for allergies in children as young as 2 years. After 2 months of daily use without a prescription in a child, talk to your pediatrician before using it for a longer time. Use of this medicine for nasal polyps is not approved in children. Overdosage: If you think you have taken too much of this medicine contact a poison control center or emergency room at once. NOTE: This medicine is only for you. Do not share this medicine with others. What if I miss a  dose? If you miss a dose, use it as soon as you remember. If it is almost time for your next dose, use only that dose and continue with your regular schedule. Do not use double or extra doses. What may interact with this medicine?  certain antibiotics like clarithromycin and telithromycin  certain medicines for fungal infections like ketoconazole, itraconazole, and voriconazole  conivaptan  nefazodone  some medicines for HIV  vaccines This list may not describe all possible interactions. Give your health care provider a list of all the medicines, herbs, non-prescription drugs, or dietary supplements you use. Also tell them if you smoke, drink alcohol, or use illegal drugs. Some items may interact with your medicine. What should I watch for while using this medicine? Visit your healthcare professional for regular checks on your progress. Tell your healthcare professional if your symptoms do not start to get better or if they get worse. This medicine may increase your risk of getting an infection. Tell your doctor or health care professional if you are around anyone with measles or chickenpox, or if you develop sores or blisters that do not heal properly. What side effects may I notice from receiving this medicine? Side effects that you should report to your doctor or health care professional as soon as possible:  allergic reactions like skin rash, itching or hives, swelling of the face, lips, or tongue  changes in vision  crusting or sores in the nose  nosebleed  signs and symptoms of infection like fever or chills; cough; sore throat  white patches or sores in the mouth or nose Side effects that usually do not require medical attention (report to your doctor or health care professional if they continue or are bothersome):  burning or irritation inside the nose or throat  changes in taste or smell  cough  headache This list may not describe all possible side effects. Call  your doctor for medical advice about side effects. You may report side effects to FDA at 1-800-FDA-1088. Where should I keep my medicine? Keep out of the reach of children. Store at room temperature between 15 and 30 degrees C (59 and 86 degrees F). Avoid exposure to extreme heat, cold, or light. Throw away any unused medicine after the expiration date. NOTE: This sheet is a summary. It may not cover all possible information. If you have questions about this medicine, talk to your doctor, pharmacist, or health care provider.  2020 Elsevier/Gold Standard (2017-11-04 14:10:08) Amoxicillin; Clavulanic Acid Tablets What is this medicine? AMOXICILLIN; CLAVULANIC ACID (a mox i SIL in; KLAV yoo lan ic AS id) is a penicillin antibiotic. It treats some infections caused by bacteria. It will not work for colds, the flu, or other viruses. This medicine may be used for other purposes; ask your health care provider or pharmacist if you have questions. COMMON BRAND  NAME(S): Augmentin What should I tell my health care provider before I take this medicine? They need to know if you have any of these conditions:  bowel disease, like colitis  kidney disease  liver disease  mononucleosis  an unusual or allergic reaction to amoxicillin, penicillin, cephalosporin, other antibiotics, clavulanic acid, other medicines, foods, dyes, or preservatives  pregnant or trying to get pregnant  breast-feeding How should I use this medicine? Take this drug by mouth. Take it as directed on the prescription label at the same time every day. Take it with food at the start of a meal or snack. Take all of this drug unless your health care provider tells you to stop it early. Keep taking it even if you think you are better. Talk to your health care provider about the use of this drug in children. While it may be prescribed for selected conditions, precautions do apply. Overdosage: If you think you have taken too much of this  medicine contact a poison control center or emergency room at once. NOTE: This medicine is only for you. Do not share this medicine with others. What if I miss a dose? If you miss a dose, take it as soon as you can. If it is almost time for your next dose, take only that dose. Do not take double or extra doses. What may interact with this medicine?  allopurinol  anticoagulants  birth control pills  methotrexate  probenecid This list may not describe all possible interactions. Give your health care provider a list of all the medicines, herbs, non-prescription drugs, or dietary supplements you use. Also tell them if you smoke, drink alcohol, or use illegal drugs. Some items may interact with your medicine. What should I watch for while using this medicine? Tell your doctor or healthcare provider if your symptoms do not improve. This medicine may cause serious skin reactions. They can happen weeks to months after starting the medicine. Contact your healthcare provider right away if you notice fevers or flu-like symptoms with a rash. The rash may be red or purple and then turn into blisters or peeling of the skin. Or, you might notice a red rash with swelling of the face, lips or lymph nodes in your neck or under your arms. Do not treat diarrhea with over the counter products. Contact your doctor if you have diarrhea that lasts more than 2 days or if it is severe and watery. If you have diabetes, you may get a false-positive result for sugar in your urine. Check with your doctor or healthcare provider. Birth control pills may not work properly while you are taking this medicine. Talk to your doctor about using an extra method of birth control. What side effects may I notice from receiving this medicine? Side effects that you should report to your doctor or health care professional as soon as possible:  allergic reactions like skin rash, itching or hives, swelling of the face, lips, or  tongue  breathing problems  dark urine  fever or chills, sore throat  redness, blistering, peeling, or loosening of the skin, including inside the mouth  seizures  trouble passing urine or change in the amount of urine  unusual bleeding, bruising  unusually weak or tired  white patches or sores in the mouth or throat Side effects that usually do not require medical attention (report to your doctor or health care professional if they continue or are bothersome):  diarrhea  dizziness  headache  nausea, vomiting  stomach  upset  vaginal or anal irritation This list may not describe all possible side effects. Call your doctor for medical advice about side effects. You may report side effects to FDA at 1-800-FDA-1088. Where should I keep my medicine? Keep out of the reach of children and pets. Store at room temperature between 20 and 25 degrees C (68 and 77 degrees F). Throw away any unused drug after the expiration date. NOTE: This sheet is a summary. It may not cover all possible information. If you have questions about this medicine, talk to your doctor, pharmacist, or health care provider.  2020 Elsevier/Gold Standard (2019-05-15 11:55:53)  Poison Ivy Dermatitis Poison ivy dermatitis is redness and soreness of the skin caused by chemicals in the leaves of the poison ivy plant. You may have very bad itching, swelling, a rash, and blisters. What are the causes?  Touching a poison ivy plant.  Touching something that has the chemical on it. This may include animals or objects that have come in contact with the plant. What increases the risk?  Going outdoors often in wooded or [place] areas.  Going outdoors without wearing protective clothing, such as closed shoes, long pants, and a long-sleeved shirt. What are the signs or symptoms?   Skin redness.  Very bad itching.  A rash that often includes bumps and blisters. ? The rash usually appears 48 hours after  exposure, if you have been exposed before. ? If this is the first time you have been exposed, the rash may not appear until a week after exposure.  Swelling. This may occur if the reaction is very bad. Symptoms usually last for 1-2 weeks. The first time you develop this condition, symptoms may last 3-4 weeks. How is this treated? This condition may be treated with:  Hydrocortisone cream or calamine lotion to relieve itching.  Oatmeal baths to soothe the skin.  Medicines, such as over-the-counter antihistamine tablets.  Oral steroid medicine for more severe reactions. Follow these instructions at home: Medicines  Take or apply over-the-counter and prescription medicines only as told by your doctor.  Use hydrocortisone cream or calamine lotion as needed to help with itching. General instructions  Do not scratch or rub your skin.  Put a cold, wet cloth (cold compress) on the affected areas or take baths in cool water. This will help with itching.  Avoid hot baths and showers.  Take oatmeal baths as needed. Use colloidal oatmeal. You can get this at a pharmacy or grocery store. Follow the instructions on the package.  While you have the rash, wash your clothes right after you wear them.  Keep all follow-up visits as told by your health care provider. This is important. How is this prevented?   Know what poison ivy looks like, so you can avoid it. ? This plant has three leaves with flowering branches on a single stem. ? The leaves are glossy. ? The leaves have uneven edges that come to a point at the front.  If you touch poison ivy, wash your skin with soap and water right away. Be sure to wash under your fingernails.  When hiking or camping, wear long pants, a long-sleeved shirt, tall socks, and hiking boots. You can also use a lotion on your skin that helps to prevent contact with poison ivy.  If you think that your clothes or outdoor gear came in contact with poison ivy,  rinse them off with a garden hose before you bring them inside your house.  When doing  yard work or gardening, wear gloves, long sleeves, long pants, and boots. Wash your garden tools and gloves if they come in contact with poison ivy.  If you think that your pet has come into contact with poison ivy, wash him or her with pet shampoo and water. Make sure to wear gloves while washing your pet. Contact a doctor if:  You have open sores in the rash area.  You have more redness, swelling, or pain in the rash area.  You have redness that spreads beyond the rash area.  You have fluid, blood, or pus coming from the rash area.  You have a fever.  You have a rash over a large area of your body.  You have a rash on your eyes, mouth, or genitals.  Your rash does not get better after a few weeks. Get help right away if:  Your face swells or your eyes swell shut.  You have trouble breathing.  You have trouble swallowing. These symptoms may be an emergency. Do not wait to see if the symptoms will go away. Get medical help right away. Call your local emergency services (911 in the [place] Do not drive yourself to the hospital. Summary  Poison ivy dermatitis is redness and soreness of the skin caused by chemicals in the leaves of the poison ivy plant.  You may have skin redness, very bad itching, swelling, and a rash.  Do not scratch or rub your skin.  Take or apply over-the-counter and prescription medicines only as told by your doctor. This information is not intended to replace advice given to you by your health care provider. Make sure you discuss any questions you have with your health care provider. Document Revised: 02/03/2019 Document Reviewed: 10/07/2018 Elsevier Patient Education  2020 [employer] Inc[employer]  Contact Dermatitis Dermatitis is redness, soreness, and swelling (inflammation) of the skin. Contact dermatitis is a reaction to something that touches the skin. There are two  types of contact dermatitis:  Irritant contact dermatitis. This happens when something bothers (irritates) your skin, like soap.  Allergic contact dermatitis. This is caused when you are exposed to something that you are allergic to, such as poison ivy. What are the causes?  Common causes of irritant contact dermatitis include: ? Makeup. ? Soaps. ? Detergents. ? Bleaches. ? Acids. ? Metals, such as nickel.  Common causes of allergic contact dermatitis include: ? Plants. ? Chemicals. ? Jewelry. ? Latex. ? Medicines. ? Preservatives in products, such as clothing. What increases the risk?  Having a job that exposes you to things that bother your skin.  Having asthma or eczema. What are the signs or symptoms? Symptoms may happen anywhere the irritant has touched your skin. Symptoms include:  Dry or flaky skin.  Redness.  Cracks.  Itching.  Pain or a burning feeling.  Blisters.  Blood or clear fluid draining from skin cracks. With allergic contact dermatitis, swelling may occur. This may happen in places such as the eyelids, mouth, or genitals. How is this treated?  This condition is treated by checking for the cause of the reaction and protecting your skin. Treatment may also include: ? Steroid creams, ointments, or medicines. ? Antibiotic medicines or other ointments, if you have a skin infection. ? Lotion or medicines to help with itching. ? A bandage (dressing). Follow these instructions at home: Skin care  Moisturize your skin as needed.  Put cool cloths on your skin.  Put a baking soda paste on your skin. Stir water into  baking soda until it looks like a paste.  Do not scratch your skin.  Avoid having things rub up against your skin.  Avoid the use of soaps, perfumes, and dyes. Medicines  Take or apply over-the-counter and prescription medicines only as told by your doctor.  If you were prescribed an antibiotic medicine, take or apply it as told  by your doctor. Do not stop using it even if your condition starts to get better. Bathing  Take a bath with: ? Epsom salts. ? Baking soda. ? Colloidal oatmeal.  Bathe less often.  Bathe in warm water. Avoid using hot water. Bandage care  If you were given a bandage, change it as told by your health care provider.  Wash your hands with soap and water before and after you change your bandage. If soap and water are not available, use hand sanitizer. General instructions  Avoid the things that caused your reaction. If you do not know what caused it, keep a journal. Write down: ? What you eat. ? What skin products you use. ? What you drink. ? What you wear in the area that has symptoms. This includes jewelry.  Check the affected areas every day for signs of infection. Check for: ? More redness, swelling, or pain. ? More fluid or blood. ? Warmth. ? Pus or a bad smell.  Keep all follow-up visits as told by your doctor. This is important. Contact a doctor if:  You do not get better with treatment.  Your condition gets worse.  You have signs of infection, such as: ? More swelling. ? Tenderness. ? More redness. ? Soreness. ? Warmth.  You have a fever.  You have new symptoms. Get help right away if:  You have a very bad headache.  You have neck pain.  Your neck is stiff.  You throw up (vomit).  You feel very sleepy.  You see red streaks coming from the area.  Your bone or joint near the area hurts after the skin has healed.  The area turns darker.  You have trouble breathing. Summary  Dermatitis is redness, soreness, and swelling of the skin.  Symptoms may occur where the irritant has touched you.  Treatment may include medicines and skin care.  If you do not know what caused your reaction, keep a journal.  Contact a doctor if your condition gets worse or you have signs of infection. This information is not intended to replace advice given to you by  your health care provider. Make sure you discuss any questions you have with your health care provider. Document Revised: 02/01/2019 Document Reviewed: 04/27/2018 Elsevier Patient Education  2020 [employer] Inc[employer] COVID-19 COVID-19 is a respiratory infection that is caused by a virus called severe acute respiratory syndrome coronavirus 2 (SARS-CoV-2). The disease is also known as coronavirus disease or novel coronavirus. In some people, the virus may not cause any symptoms. In others, it may cause a serious infection. The infection can get worse quickly and can lead to complications, such as:  Pneumonia, or infection of the lungs.  Acute respiratory distress syndrome or ARDS. This is a condition in which fluid build-up in the lungs prevents the lungs from filling with air and passing oxygen into the blood.  Acute respiratory failure. This is a condition in which there is not enough oxygen passing from the lungs to the body or when carbon dioxide is not passing from the lungs out of the body.  Sepsis or septic shock. This is a  serious bodily reaction to an infection.  Blood clotting problems.  Secondary infections due to bacteria or fungus.  Organ failure. This is when your body's organs stop working. The virus that causes COVID-19 is contagious. This means that it can spread from person to person through droplets from coughs and sneezes (respiratory secretions). What are the causes? This illness is caused by a virus. You may catch the virus by:  Breathing in droplets from an infected person. Droplets can be spread by a person breathing, speaking, singing, coughing, or sneezing.  Touching something, like a table or a doorknob, that was exposed to the virus (contaminated) and then touching your mouth, nose, or eyes. What increases the risk? Risk for infection You are more likely to be infected with this virus if you:  Are within 6 feet (2 meters) of a person with COVID-19.  Provide care  for or live with a person who is infected with COVID-19.  Spend time in crowded indoor spaces or live in shared housing. Risk for serious illness You are more likely to become seriously ill from the virus if you:  Are 34 years of age or older. The higher your age, the more you are at risk for serious illness.  Live in a nursing home or long-term care facility.  Have cancer.  Have a long-term (chronic) disease such as: ? Chronic lung disease, including chronic obstructive pulmonary disease or asthma. ? A long-term disease that lowers your body's ability to fight infection (immunocompromised). ? Heart disease, including heart failure, a condition in which the arteries that lead to the heart become narrow or blocked (coronary artery disease), a disease which makes the heart muscle thick, weak, or stiff (cardiomyopathy). ? Diabetes. ? Chronic kidney disease. ? Sickle cell disease, a condition in which red blood cells have an abnormal "sickle" shape. ? Liver disease.  Are obese. What are the signs or symptoms? Symptoms of this condition can range from mild to severe. Symptoms may appear any time from 2 to 14 days after being exposed to the virus. They include:  A fever or chills.  A cough.  Difficulty breathing.  Headaches, body aches, or muscle aches.  Runny or stuffy (congested) nose.  A sore throat.  New loss of taste or smell. Some people may also have stomach problems, such as nausea, vomiting, or diarrhea. Other people may not have any symptoms of COVID-19. How is this diagnosed? This condition may be diagnosed based on:  Your signs and symptoms, especially if: ? You live in an area with a COVID-19 outbreak. ? You recently traveled to or from an area where the virus is common. ? You provide care for or live with a person who was diagnosed with COVID-19. ? You were exposed to a person who was diagnosed with COVID-19.  A physical exam.  Lab tests, which may  include: ? Taking a sample of fluid from the back of your nose and throat (nasopharyngeal fluid), your nose, or your throat using a swab. ? A sample of mucus from your lungs (sputum). ? Blood tests.  Imaging tests, which may include, X-rays, CT scan, or ultrasound. How is this treated? At present, there is no medicine to treat COVID-19. Medicines that treat other diseases are being used on a trial basis to see if they are effective against COVID-19. Your health care provider will talk with you about ways to treat your symptoms. For most people, the infection is mild and can be managed at home with  rest, fluids, and over-the-counter medicines. Treatment for a serious infection usually takes places in a hospital intensive care unit (ICU). It may include one or more of the following treatments. These treatments are given until your symptoms improve.  Receiving fluids and medicines through an IV.  Supplemental oxygen. Extra oxygen is given through a tube in the nose, a face mask, or a hood.  Positioning you to lie on your stomach (prone position). This makes it easier for oxygen to get into the lungs.  Continuous positive airway pressure (CPAP) or bi-level positive airway pressure (BPAP) machine. This treatment uses mild air pressure to keep the airways open. A tube that is connected to a motor delivers oxygen to the body.  Ventilator. This treatment moves air into and out of the lungs by using a tube that is placed in your windpipe.  Tracheostomy. This is a procedure to create a hole in the neck so that a breathing tube can be inserted.  Extracorporeal membrane oxygenation (ECMO). This procedure gives the lungs a chance to recover by taking over the functions of the heart and lungs. It supplies oxygen to the body and removes carbon dioxide. Follow these instructions at home: Lifestyle  If you are sick, stay home except to get medical care. Your health care provider will tell you how long to  stay home. Call your health care provider before you go for medical care.  Rest at home as told by your health care provider.  Do not use any products that contain nicotine or tobacco, such as cigarettes, e-cigarettes, and chewing tobacco. If you need help quitting, ask your health care provider.  Return to your normal activities as told by your health care provider. Ask your health care provider what activities are safe for you. General instructions  Take over-the-counter and prescription medicines only as told by your health care provider.  Drink enough fluid to keep your urine pale yellow.  Keep all follow-up visits as told by your health care provider. This is important. How is this prevented?  There is no vaccine to help prevent COVID-19 infection. However, there are steps you can take to protect yourself and others from this virus. To protect yourself:   Do not travel to areas where COVID-19 is a risk. The areas where COVID-19 is reported change often. To identify high-risk areas and travel restrictions, check the CDC travel website: StageSync.si  If you live in, or must travel to, an area where COVID-19 is a risk, take precautions to avoid infection. ? Stay away from people who are sick. ? Wash your hands often with soap and water for 20 seconds. If soap and water are not available, use an alcohol-based hand sanitizer. ? Avoid touching your mouth, face, eyes, or nose. ? Avoid going out in public, follow guidance from your state and local health authorities. ? If you must go out in public, wear a cloth face covering or face mask. Make sure your mask covers your nose and mouth. ? Avoid crowded indoor spaces. Stay at least 6 feet (2 meters) away from others. ? Disinfect objects and surfaces that are frequently touched every day. This may include:  Counters and tables.  Doorknobs and light switches.  Sinks and faucets.  Electronics, such as phones, remote  controls, keyboards, computers, and tablets. To protect others: If you have symptoms of COVID-19, take steps to prevent the virus from spreading to others.  If you think you have a COVID-19 infection, contact your health care provider  right away. Tell your health care team that you think you may have a COVID-19 infection.  Stay home. Leave your house only to seek medical care. Do not use public transport.  Do not travel while you are sick.  Wash your hands often with soap and water for 20 seconds. If soap and water are not available, use alcohol-based hand sanitizer.  Stay away from other members of your household. Let healthy household members care for children and pets, if possible. If you have to care for children or pets, wash your hands often and wear a mask. If possible, stay in your own room, separate from others. Use a different bathroom.  Make sure that all people in your household wash their hands well and often.  Cough or sneeze into a tissue or your sleeve or elbow. Do not cough or sneeze into your hand or into the air.  Wear a cloth face covering or face mask. Make sure your mask covers your nose and mouth. Where to find more information  Centers for Disease Control and Prevention: StickerEmporium.tn  World Health Organization: https://thompson-craig.com/ Contact a health care provider if:  You live in or have traveled to an area where COVID-19 is a risk and you have symptoms of the infection.  You have had contact with someone who has COVID-19 and you have symptoms of the infection. Get help right away if:  You have trouble breathing.  You have pain or pressure in your chest.  You have confusion.  You have bluish lips and fingernails.  You have difficulty waking from sleep.  You have symptoms that get worse. These symptoms may represent a serious problem that is an emergency. Do not wait to see if the symptoms will go away.  Get medical help right away. Call your local emergency services (911 in the [place] Do not drive yourself to the hospital. Let the emergency medical personnel know if you think you have COVID-19. Summary  COVID-19 is a respiratory infection that is caused by a virus. It is also known as coronavirus disease or novel coronavirus. It can cause serious infections, such as pneumonia, acute respiratory distress syndrome, acute respiratory failure, or sepsis.  The virus that causes COVID-19 is contagious. This means that it can spread from person to person through droplets from breathing, speaking, singing, coughing, or sneezing.  You are more likely to develop a serious illness if you are 66 years of age or older, have a weak immune system, live in a nursing home, or have chronic disease.  There is no medicine to treat COVID-19. Your health care provider will talk with you about ways to treat your symptoms.  Take steps to protect yourself and others from infection. Wash your hands often and disinfect objects and surfaces that are frequently touched every day. Stay away from people who are sick and wear a mask if you are sick. This information is not intended to replace advice given to you by your health care provider. Make sure you discuss any questions you have with your health care provider. Document Revised: 08/11/2019 Document Reviewed: 11/17/2018 Elsevier Patient Education  2020 [employer].

## 2020-03-27 ENCOUNTER — Telehealth: Payer: Self-pay

## 2020-03-27 NOTE — Telephone Encounter (Signed)
Confirmed message below with patient and advised that he will not be due till; 06/20/21. Patient states that he did not cutt him self but did pike him self by accident, he states incident happened 4 days ago and is doing well. KW

## 2020-03-27 NOTE — Telephone Encounter (Signed)
Copied from CRM [id number]. Topic: General - Other >> Mar 27, 2020  8:47 AM Elliot Gault wrote: Patient would like a nurse to know when he received his first tetanus shot due to over weekend cutting himself on a fence. Advised the patient as per his chart not due until 05/2021 wanted to nurse to confirm. Please leave a detail message

## 2020-12-30 DIAGNOSIS — M531 Cervicobrachial syndrome: Secondary | ICD-10-CM | POA: Diagnosis not present

## 2020-12-30 DIAGNOSIS — M546 Pain in thoracic spine: Secondary | ICD-10-CM | POA: Diagnosis not present

## 2020-12-30 DIAGNOSIS — M9902 Segmental and somatic dysfunction of thoracic region: Secondary | ICD-10-CM | POA: Diagnosis not present

## 2020-12-30 DIAGNOSIS — M9901 Segmental and somatic dysfunction of cervical region: Secondary | ICD-10-CM | POA: Diagnosis not present

## 2021-01-01 DIAGNOSIS — M531 Cervicobrachial syndrome: Secondary | ICD-10-CM | POA: Diagnosis not present

## 2021-01-01 DIAGNOSIS — M9901 Segmental and somatic dysfunction of cervical region: Secondary | ICD-10-CM | POA: Diagnosis not present

## 2021-01-01 DIAGNOSIS — M9902 Segmental and somatic dysfunction of thoracic region: Secondary | ICD-10-CM | POA: Diagnosis not present

## 2021-01-01 DIAGNOSIS — M546 Pain in thoracic spine: Secondary | ICD-10-CM | POA: Diagnosis not present

## 2021-01-07 DIAGNOSIS — M546 Pain in thoracic spine: Secondary | ICD-10-CM | POA: Diagnosis not present

## 2021-01-07 DIAGNOSIS — M9902 Segmental and somatic dysfunction of thoracic region: Secondary | ICD-10-CM | POA: Diagnosis not present

## 2021-01-07 DIAGNOSIS — M9901 Segmental and somatic dysfunction of cervical region: Secondary | ICD-10-CM | POA: Diagnosis not present

## 2021-01-07 DIAGNOSIS — M531 Cervicobrachial syndrome: Secondary | ICD-10-CM | POA: Diagnosis not present

## 2021-01-21 DIAGNOSIS — M9901 Segmental and somatic dysfunction of cervical region: Secondary | ICD-10-CM | POA: Diagnosis not present

## 2021-01-21 DIAGNOSIS — M546 Pain in thoracic spine: Secondary | ICD-10-CM | POA: Diagnosis not present

## 2021-01-21 DIAGNOSIS — M531 Cervicobrachial syndrome: Secondary | ICD-10-CM | POA: Diagnosis not present

## 2021-01-21 DIAGNOSIS — M9902 Segmental and somatic dysfunction of thoracic region: Secondary | ICD-10-CM | POA: Diagnosis not present

## 2021-01-23 DIAGNOSIS — M9902 Segmental and somatic dysfunction of thoracic region: Secondary | ICD-10-CM | POA: Diagnosis not present

## 2021-01-23 DIAGNOSIS — M546 Pain in thoracic spine: Secondary | ICD-10-CM | POA: Diagnosis not present

## 2021-01-23 DIAGNOSIS — M9901 Segmental and somatic dysfunction of cervical region: Secondary | ICD-10-CM | POA: Diagnosis not present

## 2021-01-23 DIAGNOSIS — M531 Cervicobrachial syndrome: Secondary | ICD-10-CM | POA: Diagnosis not present

## 2021-01-27 DIAGNOSIS — M9902 Segmental and somatic dysfunction of thoracic region: Secondary | ICD-10-CM | POA: Diagnosis not present

## 2021-01-27 DIAGNOSIS — M546 Pain in thoracic spine: Secondary | ICD-10-CM | POA: Diagnosis not present

## 2021-01-27 DIAGNOSIS — M531 Cervicobrachial syndrome: Secondary | ICD-10-CM | POA: Diagnosis not present

## 2021-01-27 DIAGNOSIS — M9901 Segmental and somatic dysfunction of cervical region: Secondary | ICD-10-CM | POA: Diagnosis not present

## 2021-01-29 DIAGNOSIS — M9902 Segmental and somatic dysfunction of thoracic region: Secondary | ICD-10-CM | POA: Diagnosis not present

## 2021-01-29 DIAGNOSIS — M531 Cervicobrachial syndrome: Secondary | ICD-10-CM | POA: Diagnosis not present

## 2021-01-29 DIAGNOSIS — M546 Pain in thoracic spine: Secondary | ICD-10-CM | POA: Diagnosis not present

## 2021-01-29 DIAGNOSIS — M9901 Segmental and somatic dysfunction of cervical region: Secondary | ICD-10-CM | POA: Diagnosis not present

## 2021-02-04 DIAGNOSIS — M9902 Segmental and somatic dysfunction of thoracic region: Secondary | ICD-10-CM | POA: Diagnosis not present

## 2021-02-04 DIAGNOSIS — M9901 Segmental and somatic dysfunction of cervical region: Secondary | ICD-10-CM | POA: Diagnosis not present

## 2021-02-04 DIAGNOSIS — M531 Cervicobrachial syndrome: Secondary | ICD-10-CM | POA: Diagnosis not present

## 2021-02-04 DIAGNOSIS — M546 Pain in thoracic spine: Secondary | ICD-10-CM | POA: Diagnosis not present

## 2021-03-19 DIAGNOSIS — M546 Pain in thoracic spine: Secondary | ICD-10-CM | POA: Diagnosis not present

## 2021-03-19 DIAGNOSIS — M531 Cervicobrachial syndrome: Secondary | ICD-10-CM | POA: Diagnosis not present

## 2021-03-19 DIAGNOSIS — M9902 Segmental and somatic dysfunction of thoracic region: Secondary | ICD-10-CM | POA: Diagnosis not present

## 2021-03-19 DIAGNOSIS — M9901 Segmental and somatic dysfunction of cervical region: Secondary | ICD-10-CM | POA: Diagnosis not present

## 2021-04-10 DIAGNOSIS — M9902 Segmental and somatic dysfunction of thoracic region: Secondary | ICD-10-CM | POA: Diagnosis not present

## 2021-04-10 DIAGNOSIS — M546 Pain in thoracic spine: Secondary | ICD-10-CM | POA: Diagnosis not present

## 2021-04-10 DIAGNOSIS — M9901 Segmental and somatic dysfunction of cervical region: Secondary | ICD-10-CM | POA: Diagnosis not present

## 2021-04-10 DIAGNOSIS — M531 Cervicobrachial syndrome: Secondary | ICD-10-CM | POA: Diagnosis not present

## 2021-04-24 DIAGNOSIS — M9901 Segmental and somatic dysfunction of cervical region: Secondary | ICD-10-CM | POA: Diagnosis not present

## 2021-04-24 DIAGNOSIS — M531 Cervicobrachial syndrome: Secondary | ICD-10-CM | POA: Diagnosis not present

## 2021-04-24 DIAGNOSIS — M9902 Segmental and somatic dysfunction of thoracic region: Secondary | ICD-10-CM | POA: Diagnosis not present

## 2021-04-24 DIAGNOSIS — M546 Pain in thoracic spine: Secondary | ICD-10-CM | POA: Diagnosis not present

## 2022-08-07 DIAGNOSIS — Z3009 Encounter for other general counseling and advice on contraception: Secondary | ICD-10-CM | POA: Diagnosis not present

## 2022-09-15 ENCOUNTER — Ambulatory Visit (INDEPENDENT_AMBULATORY_CARE_PROVIDER_SITE_OTHER): Payer: BC Managed Care – PPO | Admitting: Physician Assistant

## 2022-09-15 ENCOUNTER — Encounter: Payer: Self-pay | Admitting: Physician Assistant

## 2022-09-15 VITALS — BP 120/73 | HR 64 | Ht 74.0 in | Wt 322.5 lb

## 2022-09-15 DIAGNOSIS — Z0000 Encounter for general adult medical examination without abnormal findings: Secondary | ICD-10-CM

## 2022-09-15 DIAGNOSIS — Z1159 Encounter for screening for other viral diseases: Secondary | ICD-10-CM | POA: Diagnosis not present

## 2022-09-15 DIAGNOSIS — Z6841 Body Mass Index (BMI) 40.0 and over, adult: Secondary | ICD-10-CM | POA: Insufficient documentation

## 2022-09-15 DIAGNOSIS — Z114 Encounter for screening for human immunodeficiency virus [HIV]: Secondary | ICD-10-CM

## 2022-09-15 DIAGNOSIS — Z833 Family history of diabetes mellitus: Secondary | ICD-10-CM

## 2022-09-15 NOTE — Progress Notes (Signed)
I,Craig [name],acting as a Education administrator for [employer], PA-C.,have documented all relevant documentation on the behalf of Craig Kirschner, PA-C,as directed by  Craig Kirschner, PA-C while in the presence of Craig Kirschner, PA-C.   Complete physical exam   Patient: Craig [name]   DOB: 03/04/1991   31 y.o. Male  MRN: [id number] Visit Date: 09/15/2022  Today's healthcare provider: Mikey Kirschner, PA-C   Cc. Cpe   Subjective    Craig [name] is a 31 y.o. male who presents today for a complete physical exam.  He reports consuming a general diet. The patient does not participate in regular exercise at present. He generally feels well. He reports sleeping well. He does not have additional problems to discuss today.   Past Medical History:  Diagnosis Date   Allergic rhinitis    Allergy    Herpes zoster    History of allergy    History of high blood pressure    Insomnia    TMJ syndrome    Past Surgical History:  Procedure Laterality Date   NO PAST SURGERIES     Social History   Socioeconomic History   Marital status: Married    Spouse name: Not on file   Number of children: Not on file   Years of education: Not on file   Highest education level: Not on file  Occupational History   Not on file  Tobacco Use   Smoking status: Never   Smokeless tobacco: Never  Vaping Use   Vaping Use: Never used  Substance and Sexual Activity   Alcohol use: Not Currently   Drug use: Never   Sexual activity: Not on file  Other Topics Concern   Not on file  Social History Narrative   Not on file   Social Determinants of Health   Financial Resource Strain: Not on file  Food Insecurity: Not on file  Transportation Needs: Not on file  Physical Activity: Not on file  Stress: Not on file  Social Connections: Not on file  Intimate Partner Violence: Not on file   Family Status  Relation Name Status   Mother  Alive   Father  Alive   Sister  Alive   Sister  Alive   Sister  Alive    MGM  (Not Specified)   Family History  Problem Relation Age of Onset   Obesity Mother    Drug abuse Father    Cancer Father        throat cancer   Diabetes Maternal Grandmother    No Known Allergies  Patient Care Team: Craig Kirschner, PA-C as PCP - General (Physician Assistant)   Medications: Outpatient Medications Prior to Visit  Medication Sig   [DISCONTINUED] amoxicillin-clavulanate (AUGMENTIN) 875-125 MG tablet Take 1 tablet by mouth 2 (two) times daily.   [DISCONTINUED] fluticasone (FLONASE) 50 MCG/ACT nasal spray Place 2 sprays into both nostrils daily. (Patient not taking: Reported on 09/15/2022)   No facility-administered medications prior to visit.    Review of Systems  Constitutional:  Negative for fatigue and fever.  Respiratory:  Negative for cough and shortness of breath.   Cardiovascular:  Negative for chest pain, palpitations and leg swelling.  Genitourinary:  Positive for frequency.  Neurological:  Negative for dizziness and headaches.     Objective    Blood pressure 120/73, pulse 64, height _0  (1.88 m), weight (!) 322 lb 8 oz (146.3 kg), SpO2 100 %.   Physical Exam Constitutional:  General: He is awake.     Appearance: He is well-developed.  HENT:     Head: Normocephalic.     Right Ear: Tympanic membrane, ear canal and external ear normal.     Left Ear: Tympanic membrane, ear canal and external ear normal.     Nose: Nose normal. No congestion or rhinorrhea.     Mouth/Throat:     Mouth: Mucous membranes are moist.     Pharynx: No oropharyngeal exudate or posterior oropharyngeal erythema.  Eyes:     Pupils: Pupils are equal, round, and reactive to light.  Cardiovascular:     Rate and Rhythm: Normal rate and regular rhythm.     Heart sounds: Normal heart sounds.  Pulmonary:     Effort: Pulmonary effort is normal.     Breath sounds: Normal breath sounds.  Abdominal:     General: There is no distension.     Palpations: Abdomen is soft.      Tenderness: There is no abdominal tenderness. There is no guarding.  Musculoskeletal:     Cervical back: Normal range of motion.     Right lower leg: No edema.     Left lower leg: No edema.  Lymphadenopathy:     Cervical: No cervical adenopathy.  Skin:    General: Skin is warm.  Neurological:     Mental Status: He is alert and oriented to person, place, and time.  Psychiatric:        Attention and Perception: Attention normal.        Mood and Affect: Mood normal.        Speech: Speech normal.        Behavior: Behavior normal. Behavior is cooperative.     Last depression screening scores    09/15/2022    1:51 PM  PHQ 2/9 Scores  PHQ - 2 Score 0  PHQ- 9 Score 0   Last fall risk screening    09/15/2022    1:51 PM  [place] in the past year? 0  Number falls in past yr: 0  Injury with Fall? 0  Risk for fall due to : No Fall Risks  Follow up Falls evaluation completed   Last Audit-C alcohol use screening    09/15/2022    1:51 PM  Alcohol Use Disorder Test (AUDIT)  1. How often do you have a drink containing alcohol? 1  2. How many drinks containing alcohol do you have on a typical day when you are drinking? 0  3. How often do you have six or more drinks on one occasion? 0  AUDIT-C Score 1   A score of 3 or more in women, and 4 or more in men indicates increased risk for alcohol abuse, EXCEPT if all of the points are from question 1   No results found for any visits on 09/15/22.  Assessment & Plan    Routine Health Maintenance and Physical Exam  Exercise Activities and Dietary recommendations --balanced diet high in fiber and protein, low in sugars, carbs, fats. --physical activity/exercise 30 minutes 3-5 times a week    Immunization History  Administered Date(s) Administered   DTaP 05/12/1990, 08/03/1990, 04/03/1991, 09/27/1991, 01/07/1995   HIB (PRP-OMP) 05/12/1990, 08/03/1990, 04/03/1991   Hepatitis B, PED/ADOLESCENT 08/05/2001, 09/09/2001,  02/17/2002   MMR 09/13/1991, 01/07/1995   Meningococcal Conjugate 01/22/2009   OPV 05/12/1990, 08/03/1990   Td 06/19/2004   Tdap 06/21/2011    Health Maintenance  Topic Date Due   COVID-19  Vaccine (1) Never done   Hepatitis C Screening  Never done   INFLUENZA VACCINE  01/24/2023 (Originally 05/26/2022)   HIV Screening  Completed   HPV VACCINES  Aged Out    Discussed health benefits of physical activity, and encouraged him to engage in regular exercise appropriate for his age and condition.  Problem List Items Addressed This Visit       Other   BMI 40.0-44.9, adult ([place])    Pt has a large build.  Advised weight loss would likely be beneficial for joint relief long term. Will check A1c and cholesterol.       Relevant Orders   Lipid panel   HgB A1c   Other Visit Diagnoses     Annual physical exam    -  Primary   Relevant Orders   TSH   Lipid panel   Comprehensive metabolic panel   CBC with Differential/Platelet   HgB A1c   Family history of diabetes mellitus in maternal grandmother       Relevant Orders   HgB A1c   Encounter for hepatitis C screening test for low risk patient       Relevant Orders   Hepatitis C Antibody   Encounter for screening for HIV       Relevant Orders   HIV antibody (with reflex)     Declines flu vaccine today.  Return in about 1 year (around 09/16/2023) for CPE.    I, Craig Kirschner, PA-C have reviewed all documentation for this visit. The documentation on  09/15/2022  for the exam, diagnosis, procedures, and orders are all accurate and complete.   Craig Kirschner, PA-C [facility] [address] [place], [place], [address] Office: [phone] Fax: [place]

## 2022-09-15 NOTE — Assessment & Plan Note (Signed)
Pt has a large build.  Advised weight loss would likely be beneficial for joint relief long term. Will check A1c and cholesterol.

## 2022-09-15 NOTE — Patient Instructions (Signed)
Please be sure to get your lab work done in [address]. Our lab is open Monday- Friday 8:00 am - 11:30 am and 1:00 pm - 4:30 pm. No appointment is needed, just your lab slip!

## 2022-09-17 LAB — LIPID PANEL
Chol/HDL Ratio: 3.3 ratio (ref 0.0–5.0)
Cholesterol, Total: 204 mg/dL — ABNORMAL HIGH (ref 100–199)
HDL: 61 mg/dL (ref >39)
LDL Chol Calc (NIH): 126 mg/dL — ABNORMAL HIGH (ref 0–99)
Triglycerides: 96 mg/dL (ref 0–149)
VLDL Cholesterol Cal: 17 mg/dL (ref 5–40)

## 2022-09-17 LAB — COMPREHENSIVE METABOLIC PANEL
ALT: 24 IU/L (ref 0–44)
AST: 15 IU/L (ref 0–40)
Albumin/Globulin Ratio: 1.7 (ref 1.2–2.2)
Albumin: 4.8 g/dL (ref 4.1–5.1)
Alkaline Phosphatase: 110 IU/L (ref 44–121)
BUN/Creatinine Ratio: 13 (ref 9–20)
BUN: 11 mg/dL (ref 6–20)
Bilirubin Total: 0.6 mg/dL (ref 0.0–1.2)
CO2: 25 mmol/L (ref 20–29)
Calcium: 9.7 mg/dL (ref 8.7–10.2)
Chloride: 102 mmol/L (ref 96–106)
Creatinine, Ser: 0.82 mg/dL (ref 0.76–1.27)
Globulin, Total: 2.8 g/dL (ref 1.5–4.5)
Glucose: 86 mg/dL (ref 70–99)
Potassium: 4.4 mmol/L (ref 3.5–5.2)
Sodium: 140 mmol/L (ref 134–144)
Total Protein: 7.6 g/dL (ref 6.0–8.5)
eGFR: 120 mL/min/{1.73_m2} (ref >59)

## 2022-09-17 LAB — CBC WITH DIFFERENTIAL/PLATELET
Basophils Absolute: 0.1 10*3/uL (ref 0.0–0.2)
Basos: 1 %
EOS (ABSOLUTE): 0.1 10*3/uL (ref 0.0–0.4)
Eos: 1 %
Hematocrit: 42.6 % (ref 37.5–51.0)
Hemoglobin: 14.1 g/dL (ref 13.0–17.7)
Immature Grans (Abs): 0 10*3/uL (ref 0.0–0.1)
Immature Granulocytes: 0 %
Lymphocytes Absolute: 2.3 10*3/uL (ref 0.7–3.1)
Lymphs: 34 %
MCH: 29.1 pg (ref 26.6–33.0)
MCHC: 33.1 g/dL (ref 31.5–35.7)
MCV: 88 fL (ref 79–97)
Monocytes Absolute: 0.6 10*3/uL (ref 0.1–0.9)
Monocytes: 8 %
Neutrophils Absolute: 3.8 10*3/uL (ref 1.4–7.0)
Neutrophils: 56 %
Platelets: 298 10*3/uL (ref 150–450)
RBC: 4.84 x10E6/uL (ref 4.14–5.80)
RDW: 13.2 % (ref 11.6–15.4)
WBC: 6.8 10*3/uL (ref 3.4–10.8)

## 2022-09-17 LAB — HEPATITIS C ANTIBODY: Hep C Virus Ab: NONREACTIVE

## 2022-09-17 LAB — TSH: TSH: 2.4 u[IU]/mL (ref 0.450–4.500)

## 2022-09-17 LAB — HIV ANTIBODY (ROUTINE TESTING W REFLEX): HIV Screen 4th Generation wRfx: NONREACTIVE

## 2022-09-17 LAB — HEMOGLOBIN A1C
Est. average glucose Bld gHb Est-mCnc: 103 mg/dL
Hgb A1c MFr Bld: 5.2 % (ref 4.8–5.6)

## 2023-07-13 DIAGNOSIS — M531 Cervicobrachial syndrome: Secondary | ICD-10-CM | POA: Diagnosis not present

## 2023-07-13 DIAGNOSIS — M9901 Segmental and somatic dysfunction of cervical region: Secondary | ICD-10-CM | POA: Diagnosis not present

## 2023-07-13 DIAGNOSIS — M9902 Segmental and somatic dysfunction of thoracic region: Secondary | ICD-10-CM | POA: Diagnosis not present

## 2023-07-13 DIAGNOSIS — M546 Pain in thoracic spine: Secondary | ICD-10-CM | POA: Diagnosis not present

## 2023-08-02 DIAGNOSIS — L237 Allergic contact dermatitis due to plants, except food: Secondary | ICD-10-CM | POA: Diagnosis not present

## 2023-08-02 DIAGNOSIS — M19012 Primary osteoarthritis, left shoulder: Secondary | ICD-10-CM | POA: Diagnosis not present

## 2023-08-02 DIAGNOSIS — M25512 Pain in left shoulder: Secondary | ICD-10-CM | POA: Diagnosis not present

## 2023-08-02 DIAGNOSIS — M7532 Calcific tendinitis of left shoulder: Secondary | ICD-10-CM | POA: Diagnosis not present

## 2023-08-18 DIAGNOSIS — M9901 Segmental and somatic dysfunction of cervical region: Secondary | ICD-10-CM | POA: Diagnosis not present

## 2023-08-18 DIAGNOSIS — M546 Pain in thoracic spine: Secondary | ICD-10-CM | POA: Diagnosis not present

## 2023-08-18 DIAGNOSIS — M531 Cervicobrachial syndrome: Secondary | ICD-10-CM | POA: Diagnosis not present

## 2023-08-18 DIAGNOSIS — M9902 Segmental and somatic dysfunction of thoracic region: Secondary | ICD-10-CM | POA: Diagnosis not present

## 2023-09-17 ENCOUNTER — Encounter: Payer: BC Managed Care – PPO | Admitting: Physician Assistant

## 2023-09-21 ENCOUNTER — Encounter: Payer: Self-pay | Admitting: Family Medicine

## 2023-09-21 ENCOUNTER — Ambulatory Visit (INDEPENDENT_AMBULATORY_CARE_PROVIDER_SITE_OTHER): Payer: BC Managed Care – PPO | Admitting: Family Medicine

## 2023-09-21 VITALS — BP 129/77 | HR 65 | Ht 74.0 in | Wt 272.0 lb

## 2023-09-21 DIAGNOSIS — Z0000 Encounter for general adult medical examination without abnormal findings: Secondary | ICD-10-CM | POA: Diagnosis not present

## 2023-09-21 NOTE — Patient Instructions (Signed)
It was a pleasure to meet you! Your labs will return on mychart within 24-48 hours.  Please let us know if you have any questions.  Thank you, Jacky Kindle, FNP  [facility] [address] [place], [place] [address] [phone] (phone) [phone] (fax) [facility]

## 2023-09-21 NOTE — Assessment & Plan Note (Signed)

## 2023-09-21 NOTE — Progress Notes (Signed)
Complete physical exam  Patient: Craig [name]   DOB: [birth year]   32 y.o. Male  MRN: [id number] Visit Date: 09/21/2023  Today's healthcare provider: Jacky Kindle, FNP   Introduced to nurse practitioner role and practice setting.  All questions answered.  Discussed provider/patient relationship and expectations.  Pt was previous patient of Drubel PA; presents for CPE  Subjective    Craig [name] is a 32 y.o. male who presents today for a complete physical exam.   He reports consuming a general diet. The patient has a physically strenuous job, but has no regular exercise apart from work.  He generally feels fairly well. He reports sleeping fairly well. He does not have additional problems to discuss today.   Slight elevation in Blood Pressure; however, improved from previous. Body mass index is 34.92 kg/m. Remains elevated; however, 50# weight loss in last year. Continue to recommend balanced, lower carb meals. Smaller meal size, adding snacks. Choosing water as drink of choice and increasing purposeful exercise.  Past Medical History:  Diagnosis Date   Allergic rhinitis    Allergy    Herpes zoster    History of allergy    History of high blood pressure    Insomnia    TMJ syndrome    Past Surgical History:  Procedure Laterality Date   NO PAST SURGERIES     Social History   Socioeconomic History   Marital status: Married    Spouse name: Not on file   Number of children: Not on file   Years of education: Not on file   Highest education level: Not on file  Occupational History   Not on file  Tobacco Use   Smoking status: Never   Smokeless tobacco: Never  Vaping Use   Vaping status: Never Used  Substance and Sexual Activity   Alcohol use: Not Currently   Drug use: Never   Sexual activity: Not on file  Other Topics Concern   Not on file  Social History Narrative   Not on file   Social Determinants of Health   Financial Resource Strain: Not on file  Food  Insecurity: Not on file  Transportation Needs: Not on file  Physical Activity: Not on file  Stress: Not on file  Social Connections: Not on file  Intimate Partner Violence: Not on file   Family Status  Relation Name Status   Mother  Alive   Father  Alive   Sister  Alive   Sister  Alive   Sister  Alive   MGM  (Not Specified)  No partnership data on file   Family History  Problem Relation Age of Onset   Obesity Mother    Drug abuse Father    Cancer Father        throat cancer   Diabetes Maternal Grandmother    No Known Allergies  Patient Care Team: Jacky Kindle, FNP as PCP - General (Family Medicine)   Medications: Outpatient Medications Prior to Visit  Medication Sig   cyclobenzaprine (FLEXERIL) 10 MG tablet Take 10 mg by mouth at bedtime as needed.   predniSONE (DELTASONE) 20 MG tablet Take 20 mg by mouth daily with breakfast.   No facility-administered medications prior to visit.    Review of Systems Last CBC Lab Results  Component Value Date   WBC 6.8 09/16/2022   HGB 14.1 09/16/2022   HCT 42.6 09/16/2022   MCV 88 09/16/2022   MCH 29.1 09/16/2022   RDW 13.2 09/16/2022  PLT 298 09/16/2022   Last metabolic panel Lab Results  Component Value Date   GLUCOSE 86 09/16/2022   NA 140 09/16/2022   K 4.4 09/16/2022   CL 102 09/16/2022   CO2 25 09/16/2022   BUN 11 09/16/2022   CREATININE 0.82 09/16/2022   EGFR 120 09/16/2022   CALCIUM 9.7 09/16/2022   PROT 7.6 09/16/2022   ALBUMIN 4.8 09/16/2022   LABGLOB 2.8 09/16/2022   AGRATIO 1.7 09/16/2022   BILITOT 0.6 09/16/2022   ALKPHOS 110 09/16/2022   AST 15 09/16/2022   ALT 24 09/16/2022   ANIONGAP 8 03/22/2012   Last lipids Lab Results  Component Value Date   CHOL 204 (H) 09/16/2022   HDL 61 09/16/2022   LDLCALC 126 (H) 09/16/2022   TRIG 96 09/16/2022   CHOLHDL 3.3 09/16/2022   Last hemoglobin A1c Lab Results  Component Value Date   HGBA1C 5.2 09/16/2022   Last thyroid functions Lab Results   Component Value Date   TSH 2.400 09/16/2022    Objective    BP 129/77 (BP Location: Left Arm, Patient Position: Sitting, Cuff Size: Normal)   Pulse 65   Ht 6\' 2"  (1.88 m)   Wt 272 lb (123.4 kg)   SpO2 98%   BMI 34.92 kg/m   BP Readings from Last 3 Encounters:  09/21/23 129/77  09/15/22 120/73  08/10/18 (!) 152/88   Wt Readings from Last 3 Encounters:  09/21/23 272 lb (123.4 kg)  09/15/22 (!) 322 lb 8 oz (146.3 kg)  09/24/17 (!) 320 lb 6.4 oz (145.3 kg)   Physical Exam Vitals and nursing note reviewed.  Constitutional:      General: He is awake. He is not in acute distress.    Appearance: Normal appearance. He is well-developed and well-groomed. He is obese. He is not ill-appearing, toxic-appearing or diaphoretic.  HENT:     Head: Normocephalic and atraumatic.     Jaw: There is normal jaw occlusion. No trismus, tenderness, swelling or pain on movement.     Salivary Glands: Right salivary gland is not diffusely enlarged or tender. Left salivary gland is not diffusely enlarged or tender.     Right Ear: Hearing, tympanic membrane, ear canal and external ear normal. There is no impacted cerumen.     Left Ear: Hearing, tympanic membrane, ear canal and external ear normal. There is no impacted cerumen.     Nose: Nose normal. No congestion or rhinorrhea.     Right Turbinates: Not enlarged, swollen or pale.     Left Turbinates: Not enlarged, swollen or pale.     Right Sinus: No maxillary sinus tenderness or frontal sinus tenderness.     Left Sinus: No maxillary sinus tenderness or frontal sinus tenderness.     Mouth/Throat:     Lips: Pink.     Mouth: Mucous membranes are moist. No injury, lacerations, oral lesions or angioedema.     Pharynx: Oropharynx is clear. Uvula midline. No pharyngeal swelling, oropharyngeal exudate or posterior oropharyngeal erythema.     Tonsils: No tonsillar exudate or tonsillar abscesses.  Eyes:     General: Lids are normal. Vision grossly intact. Gaze  aligned appropriately.        Right eye: No discharge.        Left eye: No discharge.     Extraocular Movements: Extraocular movements intact.     Conjunctiva/sclera: Conjunctivae normal.     Pupils: Pupils are equal, round, and reactive to light.  Neck:  Thyroid: No thyroid mass, thyromegaly or thyroid tenderness.     Vascular: No carotid bruit.     Trachea: Trachea normal. No tracheal tenderness.  Cardiovascular:     Rate and Rhythm: Normal rate and regular rhythm.     Pulses: Normal pulses.          Carotid pulses are 2+ on the right side and 2+ on the left side.      Radial pulses are 2+ on the right side and 2+ on the left side.       Femoral pulses are 2+ on the right side and 2+ on the left side.      Popliteal pulses are 2+ on the right side and 2+ on the left side.       Dorsalis pedis pulses are 2+ on the right side and 2+ on the left side.       Posterior tibial pulses are 2+ on the right side and 2+ on the left side.     Heart sounds: Normal heart sounds, S1 normal and S2 normal. No murmur heard.    No friction rub. No gallop.  Pulmonary:     Effort: Pulmonary effort is normal. No respiratory distress.     Breath sounds: Normal breath sounds and air entry. No stridor. No wheezing, rhonchi or rales.  Chest:     Chest wall: No tenderness.  Abdominal:     General: Abdomen is flat. Bowel sounds are normal. There is no distension.     Palpations: Abdomen is soft. There is no mass.     Tenderness: There is no abdominal tenderness. There is no guarding or rebound.     Hernia: No hernia is present.  Genitourinary:    Comments: Exam deferred; denies complaints Musculoskeletal:        General: No swelling, tenderness, deformity or signs of injury. Normal range of motion.     Cervical back: Normal range of motion and neck supple. No rigidity or tenderness.     Right lower leg: No edema.     Left lower leg: No edema.  Lymphadenopathy:     Cervical: No cervical adenopathy.      Right cervical: No superficial, deep or posterior cervical adenopathy.    Left cervical: No superficial, deep or posterior cervical adenopathy.  Skin:    General: Skin is warm and dry.     Capillary Refill: Capillary refill takes less than 2 seconds.     Coloration: Skin is not jaundiced or pale.     Findings: No bruising, erythema, lesion or rash.  Neurological:     General: No focal deficit present.     Mental Status: He is alert and oriented to person, place, and time. Mental status is at baseline.     GCS: GCS eye subscore is 4. GCS verbal subscore is 5. GCS motor subscore is 6.     Sensory: Sensation is intact. No sensory deficit.     Motor: Motor function is intact. No weakness.     Coordination: Coordination is intact.     Gait: Gait is intact.  Psychiatric:        Attention and Perception: Attention and perception normal.        Mood and Affect: Mood and affect normal.        Speech: Speech normal.        Behavior: Behavior normal. Behavior is cooperative.        Thought Content: Thought content normal.  Cognition and Memory: Cognition normal.        Judgment: Judgment normal.     Last depression screening scores    09/21/2023   10:14 AM 09/15/2022    1:51 PM  PHQ 2/9 Scores  PHQ - 2 Score 0 0  PHQ- 9 Score 0 0   Last fall risk screening    09/21/2023   10:14 AM  Fall Risk   Falls in the past year? 0  Number falls in past yr: 0  Injury with Fall? 0   Last Audit-C alcohol use screening    09/15/2022    1:51 PM  Alcohol Use Disorder Test (AUDIT)  1. How often do you have a drink containing alcohol? 1  2. How many drinks containing alcohol do you have on a typical day when you are drinking? 0  3. How often do you have six or more drinks on one occasion? 0  AUDIT-C Score 1   A score of 3 or more in women, and 4 or more in men indicates increased risk for alcohol abuse, EXCEPT if all of the points are from question 1   No results found for any visits  on 09/21/23.  Assessment & Plan    Routine Health Maintenance and Physical Exam  Exercise Activities and Dietary recommendations  Goals   None     Immunization History  Administered Date(s) Administered   DTaP 05/12/1990, 08/03/1990, 04/03/1991, 09/27/1991, 01/07/1995   HIB (PRP-OMP) 05/12/1990, 08/03/1990, 04/03/1991   Hepatitis B, PED/ADOLESCENT 08/05/2001, 09/09/2001, 02/17/2002   MMR 09/13/1991, 01/07/1995   Meningococcal Conjugate 01/22/2009   OPV 05/12/1990, 08/03/1990   Td 06/19/2004   Tdap 06/21/2011    Health Maintenance  Topic Date Due   DTaP/Tdap/Td (8 - Td or Tdap) 06/20/2021   COVID-19 Vaccine (1 - 2023-24 season) Never done   INFLUENZA VACCINE  01/24/2024 (Originally 05/27/2023)   Hepatitis C Screening  Completed   HIV Screening  Completed   HPV VACCINES  Aged Out    Discussed health benefits of physical activity, and encouraged him to engage in regular exercise appropriate for his age and condition.  Problem List Items Addressed This Visit       Other   Annual physical exam - Primary    Things to do to keep yourself healthy  - Exercise at least 30-45 minutes a day, 3-4 days a week.  - Eat a low-fat diet with lots of fruits and vegetables, up to 7-9 servings per day.  - Seatbelts can save your life. Wear them always.  - Smoke detectors on every level of your home, check batteries every year.  - Eye Doctor - have an eye exam every 1-2 years  - Safe sex - if you may be exposed to STDs, use a condom.  - Alcohol -  If you drink, do it moderately, less than 2 drinks per day.  - Health Care Power of Attorney. Choose someone to speak for you if you are not able.  - Depression is common in our stressful world.If you're feeling down or losing interest in things you normally enjoy, please come in for a visit.  - Violence - If anyone is threatening or hurting you, please call immediately.       Relevant Orders   CBC with Differential/Platelet   Comprehensive  metabolic panel   Lipid panel   TSH   Return in about 1 year (around 09/20/2024), or if symptoms worsen or fail to improve, for annual examination.  Leilani Merl, FNP, have reviewed all documentation for this visit. The documentation on 09/21/23 for the exam, diagnosis, procedures, and orders are all accurate and complete.  Jacky Kindle, FNP  [id number] [facility] [phone] (phone) [phone] (fax)  [facility]

## 2023-09-22 LAB — CBC WITH DIFFERENTIAL/PLATELET
Basophils Absolute: 0 10*3/uL (ref 0.0–0.2)
Basos: 1 %
EOS (ABSOLUTE): 0.1 10*3/uL (ref 0.0–0.4)
Eos: 2 %
Hematocrit: 42.4 % (ref 37.5–51.0)
Hemoglobin: 13.6 g/dL (ref 13.0–17.7)
Immature Grans (Abs): 0 10*3/uL (ref 0.0–0.1)
Immature Granulocytes: 0 %
Lymphocytes Absolute: 1.5 10*3/uL (ref 0.7–3.1)
Lymphs: 32 %
MCH: 29.5 pg (ref 26.6–33.0)
MCHC: 32.1 g/dL (ref 31.5–35.7)
MCV: 92 fL (ref 79–97)
Monocytes Absolute: 0.4 10*3/uL (ref 0.1–0.9)
Monocytes: 9 %
Neutrophils Absolute: 2.6 10*3/uL (ref 1.4–7.0)
Neutrophils: 56 %
Platelets: 351 10*3/uL (ref 150–450)
RBC: 4.61 x10E6/uL (ref 4.14–5.80)
RDW: 13.7 % (ref 11.6–15.4)
WBC: 4.7 10*3/uL (ref 3.4–10.8)

## 2023-09-22 LAB — COMPREHENSIVE METABOLIC PANEL
ALT: 21 [IU]/L (ref 0–44)
AST: 17 [IU]/L (ref 0–40)
Albumin: 4.3 g/dL (ref 4.1–5.1)
Alkaline Phosphatase: 86 [IU]/L (ref 44–121)
BUN/Creatinine Ratio: 8 — ABNORMAL LOW (ref 9–20)
BUN: 6 mg/dL (ref 6–20)
Bilirubin Total: 0.5 mg/dL (ref 0.0–1.2)
CO2: 23 mmol/L (ref 20–29)
Calcium: 9.3 mg/dL (ref 8.7–10.2)
Chloride: 107 mmol/L — ABNORMAL HIGH (ref 96–106)
Creatinine, Ser: 0.77 mg/dL (ref 0.76–1.27)
Globulin, Total: 2.5 g/dL (ref 1.5–4.5)
Glucose: 84 mg/dL (ref 70–99)
Potassium: 4.8 mmol/L (ref 3.5–5.2)
Sodium: 144 mmol/L (ref 134–144)
Total Protein: 6.8 g/dL (ref 6.0–8.5)
eGFR: 121 mL/min/{1.73_m2} (ref >59)

## 2023-09-22 LAB — LIPID PANEL
Chol/HDL Ratio: 2.7 {ratio} (ref 0.0–5.0)
Cholesterol, Total: 170 mg/dL (ref 100–199)
HDL: 63 mg/dL (ref >39)
LDL Chol Calc (NIH): 95 mg/dL (ref 0–99)
Triglycerides: 63 mg/dL (ref 0–149)
VLDL Cholesterol Cal: 12 mg/dL (ref 5–40)

## 2023-09-22 LAB — TSH: TSH: 1.11 u[IU]/mL (ref 0.450–4.500)

## 2024-09-26 ENCOUNTER — Encounter: Payer: BC Managed Care – PPO | Admitting: Family Medicine
